# Patient Record
Sex: Male | Born: 1985
Health system: Southern US, Community
[De-identification: ages and names within clinical notes are randomized; demographics above are authoritative.]

## PROBLEM LIST (undated history)

## (undated) DIAGNOSIS — E785 Hyperlipidemia, unspecified: Secondary | ICD-10-CM

## (undated) DIAGNOSIS — E669 Obesity, unspecified: Secondary | ICD-10-CM

## (undated) HISTORY — DX: Hyperlipidemia, unspecified: E78.5

---
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
## 2004-04-08 ENCOUNTER — Ambulatory Visit: Payer: Self-pay | Admitting: Internal Medicine

## 2004-05-22 ENCOUNTER — Emergency Department (HOSPITAL_COMMUNITY): Admission: EM | Admit: 2004-05-22 | Discharge: 2004-05-22 | Payer: Self-pay | Admitting: Emergency Medicine

## 2006-03-03 ENCOUNTER — Ambulatory Visit: Payer: Self-pay | Admitting: Internal Medicine

## 2009-05-28 ENCOUNTER — Ambulatory Visit: Payer: Self-pay | Admitting: Internal Medicine

## 2009-05-28 DIAGNOSIS — H6690 Otitis media, unspecified, unspecified ear: Secondary | ICD-10-CM | POA: Insufficient documentation

## 2010-03-16 NOTE — Assessment & Plan Note (Signed)
Summary: NEW TO RE-EST/ [employer]/ HEARING PROBLEM/NWS   Vital Signs:  Patient profile:   25 year old male Height:      68 inches Weight:      242.50 pounds BMI:     37.01 O2 Sat:      99 % on Room air Temp:     98 degrees F oral Pulse rate:   69 / minute BP sitting:   108 / 72  (left arm) Cuff size:   large  Vitals Entered ByZella Ball Ewing (May 28, 2009 1:11 PM)  O2 Flow:  Room air  Preventive Care Screening  Last Tetanus Booster:    Date:  02/14/2006    Results:  Tdap   CC: New to re-establish,right ear hearing problems/RE   CC:  New to re-establish and right ear hearing problems/RE.  History of Present Illness: here with sudden onset mild hearing loss on the right jsut after shower yest; very small pain involved; no sinus or other compaloints, no fever, ST , cough;  headache, no dizziness.  No prior hx of this.  Pt denies new neuro symptoms such as headache, facial or extremity weakness   Preventive Screening-Counseling & Management  Alcohol-Tobacco     Smoking Status: never      Drug Use:  no.    Problems Prior to Update: 1)  Otitis Media, Acute, Right  (ICD-382.9)  Medications Prior to Update: 1)  None  Current Medications (verified): 1)  Clarithromycin 500 Mg Tabs (Clarithromycin) .Marland Kitchen.. 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 mother with HTN father with HTN, elev chol, asthma  Social History: Reviewed history and no changes  required. 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   Head:  normocephalic and atraumatic.   Eyes:  vision grossly intact, pupils equal, and pupils round.   Ears:  right tm mod to severe erythema, slight bulging, left TM ok, no wax in canals Nose:  no external deformity and no nasal discharge.   Mouth:  no gingival abnormalities and pharynx pink and moist.   Neck:  supple and no masses.   Lungs:  normal respiratory effort and normal breath sounds.   Heart:  normal rate and regular rhythm.   Extremities:  no edema, no erythema  Neurologic:  cranial nerves II-XII intact and strength normal in all extremities.  except for mild right hearing decrease   Impression & Recommendations:  Problem # 1:  OTITIS MEDIA, ACUTE, RIGHT (ICD-382.9)  His updated medication list for this problem includes:    Clarithromycin 500 Mg Tabs (Clarithromycin) .Marland Kitchen... 1 by mouth two times a day treat as above, f/u any worsening signs or symptoms   Complete Medication List: 1)  Clarithromycin 500 Mg Tabs (Clarithromycin) .Marland KitchenMarland KitchenMarland Kitchen  1 by mouth two times a day  Patient Instructions: 1)  Please take all new medications as prescribed 2)  Continue all previous medications as before this visit  3)  You can also use Mucinex OTC or it's generic for congestion , as well Sudafed OTC. 4)  Please schedule a follow-up appointment as needed. Prescriptions: CLARITHROMYCIN 500 MG TABS (CLARITHROMYCIN) 1 by mouth two times a day  #20 x 0   Entered and Authorized by:   Corwin Levins MD   Signed by:   Corwin Levins MD on 05/28/2009   Method used:   Print then Give to Patient   RxID:   [id number]

## 2011-05-12 ENCOUNTER — Encounter: Payer: Self-pay | Admitting: Internal Medicine

## 2011-05-12 DIAGNOSIS — Z0001 Encounter for general adult medical examination with abnormal findings: Secondary | ICD-10-CM | POA: Insufficient documentation

## 2011-05-12 DIAGNOSIS — Z0000 Encounter for general adult medical examination without abnormal findings: Secondary | ICD-10-CM | POA: Insufficient documentation

## 2011-05-16 ENCOUNTER — Encounter: Payer: Self-pay | Admitting: Internal Medicine

## 2011-05-16 ENCOUNTER — Ambulatory Visit (INDEPENDENT_AMBULATORY_CARE_PROVIDER_SITE_OTHER): Payer: 59 | Admitting: Internal Medicine

## 2011-05-16 ENCOUNTER — Other Ambulatory Visit (INDEPENDENT_AMBULATORY_CARE_PROVIDER_SITE_OTHER): Payer: 59

## 2011-05-16 VITALS — BP 120/72 | HR 107 | Temp 98.0°F | Ht 68.0 in | Wt 252.4 lb

## 2011-05-16 DIAGNOSIS — A64 Unspecified sexually transmitted disease: Secondary | ICD-10-CM

## 2011-05-16 DIAGNOSIS — Z0000 Encounter for general adult medical examination without abnormal findings: Secondary | ICD-10-CM

## 2011-05-16 LAB — URINALYSIS, ROUTINE W REFLEX MICROSCOPIC
Bilirubin Urine: NEGATIVE
Ketones, ur: NEGATIVE
Leukocytes, UA: NEGATIVE
Urine Glucose: NEGATIVE
pH: 6 (ref 5.0–8.0)

## 2011-05-16 LAB — HEPATIC FUNCTION PANEL
AST: 20 U/L (ref 0–37)
Alkaline Phosphatase: 54 U/L (ref 39–117)
Bilirubin, Direct: 0 mg/dL (ref 0.0–0.3)
Total Bilirubin: 0.2 mg/dL — ABNORMAL LOW (ref 0.3–1.2)

## 2011-05-16 LAB — CBC WITH DIFFERENTIAL/PLATELET
Basophils Absolute: 0 10*3/uL (ref 0.0–0.1)
Eosinophils Relative: 1 % (ref 0.0–5.0)
HCT: 44.4 % (ref 39.0–52.0)
Lymphs Abs: 1.7 10*3/uL (ref 0.7–4.0)
MCV: 72.7 fl — ABNORMAL LOW (ref 78.0–100.0)
Monocytes Absolute: 0.4 10*3/uL (ref 0.1–1.0)
Platelets: 228 10*3/uL (ref 150.0–400.0)
RDW: 15.5 % — ABNORMAL HIGH (ref 11.5–14.6)

## 2011-05-16 LAB — LIPID PANEL
LDL Cholesterol: 112 mg/dL — ABNORMAL HIGH (ref 0–99)
VLDL: 33.8 mg/dL (ref 0.0–40.0)

## 2011-05-16 LAB — BASIC METABOLIC PANEL
GFR: 116.88 mL/min (ref >60.00)
Potassium: 3.9 mEq/L (ref 3.5–5.1)
Sodium: 141 mEq/L (ref 135–145)

## 2011-05-16 LAB — TSH: TSH: 1.08 u[IU]/mL (ref 0.35–5.50)

## 2011-05-16 NOTE — Assessment & Plan Note (Signed)
Asympt, pt reqeusts labs as girlfriend may not be monogamous

## 2011-05-16 NOTE — Patient Instructions (Signed)
You are up to date with prevention today Please focus on being more active, less calories, low cholesterol diet, and weight control Please go to LAB in the Basement for the blood and/or urine tests to be done today (including the ones you asked for) You will be contacted by phone if any changes need to be made immediately.  Otherwise, you will receive a letter about your results with an explanation.

## 2011-05-16 NOTE — Assessment & Plan Note (Signed)

## 2011-05-16 NOTE — Progress Notes (Signed)
  Subjective:    Patient ID: John Porter, male    DOB: 1985/11/30, 26 y.o.   MRN: [id number]  HPI  Here for wellness and f/u;  Overall doing ok;  Pt denies CP, worsening SOB, DOE, wheezing, orthopnea, PND, worsening LE edema, palpitations, dizziness or syncope.  Pt denies neurological change such as new Headache, facial or extremity weakness.  Pt denies polydipsia, polyuria, or low sugar symptoms. Pt states overall good compliance with treatment and medications, good tolerability, and trying to follow lower cholesterol diet.  Pt denies worsening depressive symptoms, suicidal ideation or panic. No fever, wt loss, night sweats, loss of appetite, or other constitutional symptoms.  Pt states good ability with ADL's, low fall risk, home safety reviewed and adequate, no significant changes in hearing or vision, and occasionally active with exercise.  Asks for STD testing as he is not sure his girlfriend is monogamous.  Denies urinary symptoms such as dysuria, frequency, urgency,or hematuria, and no ulcers, vesicles, d/c History reviewed. No pertinent past medical history. History reviewed. No pertinent past surgical history.  reports that he has never smoked. He does not have any smokeless tobacco history on file. He reports that he does not drink alcohol or use illicit drugs. family history includes Asthma in his father; Hyperlipidemia in his father; and Hypertension in his father and mother. No Known Allergies No current outpatient prescriptions on file prior to visit.   Review of Systems Review of Systems  Constitutional: Negative for diaphoresis, activity change, appetite change and unexpected weight change.  HENT: Negative for hearing loss, ear pain, facial swelling, mouth sores and neck stiffness.   Eyes: Negative for pain, redness and visual disturbance.  Respiratory: Negative for shortness of breath and wheezing.   Cardiovascular: Negative for chest pain and palpitations.  Gastrointestinal:  Negative for diarrhea, blood in stool, abdominal distention and rectal pain.  Genitourinary: Negative for hematuria, flank pain and decreased urine volume.  Musculoskeletal: Negative for myalgias and joint swelling.  Skin: Negative for color change and wound.  Neurological: Negative for syncope and numbness.  Hematological: Negative for adenopathy.  Psychiatric/Behavioral: Negative for hallucinations, self-injury, decreased concentration and agitation.      Objective:   Physical Exam BP 120/72  Pulse 107  Temp(Src) 98 F (36.7 C) (Oral)  Ht 5\' 8"  (1.727 m)  Wt 252 lb 6 oz (114.477 kg)  BMI 38.37 kg/m2  SpO2 97% Physical Exam  VS noted Constitutional: Pt is oriented to person, place, and time. Appears well-developed and well-nourished. /severe obese HENT:  Head: Normocephalic and atraumatic.  Right Ear: External ear normal.  Left Ear: External ear normal.  Nose: Nose normal.  Mouth/Throat: Oropharynx is clear and moist.  Eyes: Conjunctivae and EOM are normal. Pupils are equal, round, and reactive to light.  Neck: Normal range of motion. Neck supple. No JVD present. No tracheal deviation present.  Cardiovascular: Normal rate, regular rhythm, normal heart sounds and intact distal pulses.   Pulmonary/Chest: Effort normal and breath sounds normal.  Abdominal: Soft. Bowel sounds are normal. There is no tenderness.  Musculoskeletal: Normal range of motion. Exhibits no edema.  Lymphadenopathy:  Has no cervical adenopathy.  Neurological: Pt is alert and oriented to person, place, and time. Pt has normal reflexes. No cranial nerve deficit.  Skin: Skin is warm and dry. No rash noted.  Psychiatric:  Has  normal mood and affect. Behavior is normal.  GU: normal male without rash, ulcer, d/c    Assessment & Plan:

## 2011-05-17 ENCOUNTER — Encounter: Payer: Self-pay | Admitting: Internal Medicine

## 2011-05-17 DIAGNOSIS — A6000 Herpesviral infection of urogenital system, unspecified: Secondary | ICD-10-CM | POA: Insufficient documentation

## 2011-05-17 LAB — HEPATITIS PANEL, ACUTE: Hep A IgM: NEGATIVE

## 2011-05-17 LAB — HSV 2 ANTIBODY, IGG: HSV 2 Glycoprotein G Ab, IgG: 2.3 IV — ABNORMAL HIGH

## 2011-05-17 LAB — RPR

## 2011-06-01 ENCOUNTER — Ambulatory Visit (INDEPENDENT_AMBULATORY_CARE_PROVIDER_SITE_OTHER): Payer: 59 | Admitting: Internal Medicine

## 2011-06-01 ENCOUNTER — Encounter: Payer: Self-pay | Admitting: Internal Medicine

## 2011-06-01 DIAGNOSIS — A6000 Herpesviral infection of urogenital system, unspecified: Secondary | ICD-10-CM

## 2011-06-01 DIAGNOSIS — E785 Hyperlipidemia, unspecified: Secondary | ICD-10-CM

## 2011-06-01 HISTORY — DX: Hyperlipidemia, unspecified: E78.5

## 2011-06-01 MED ORDER — VALACYCLOVIR HCL 500 MG PO TABS: 500.0000 mg | ORAL_TABLET | Freq: Two times a day (BID) | ORAL | Status: DC

## 2011-06-01 NOTE — Patient Instructions (Addendum)
Take all new medications as prescribed Continue all other medications as before Please follow lower cholesterol diet

## 2011-06-05 ENCOUNTER — Encounter: Payer: Self-pay | Admitting: Internal Medicine

## 2011-06-05 NOTE — Assessment & Plan Note (Signed)
Lab Results  Component Value Date   LDLCALC 112* 05/16/2011   For lower chol diet,  to f/u any worsening symptoms or concerns

## 2011-06-05 NOTE — Progress Notes (Signed)
  Subjective:    Patient ID: John Porter, male    DOB: 1985/02/28, 26 y.o.   MRN: [id number]  HPI  Here to f/u recent labs, no complaints.  Pt denies chest pain, increased sob or doe, wheezing, orthopnea, PND, increased LE swelling, palpitations, dizziness or syncope.  Trying to follow lower chol diet.   Denies urinary symptoms such as dysuria, frequency, urgency,or hematuria or rash such as herpes. Past Medical History  Diagnosis Date  . Hyperlipidemia 06/01/2011   No past surgical history on file.  reports that he has never smoked. He does not have any smokeless tobacco history on file. He reports that he does not drink alcohol or use illicit drugs. family history includes Asthma in his father; Hyperlipidemia in his father; and Hypertension in his father and mother. No Known Allergies No current outpatient prescriptions on file prior to visit.   Review of Systems All otherwise neg per pt     Objective:   Physical Exam BP 118/78  Pulse 82  Temp(Src) 98.7 F (37.1 C) (Oral)  Ht 5\' 7"  (1.702 m)  Wt 253 lb (114.76 kg)  BMI 39.63 kg/m2  SpO2 97% Physical Exam  VS noted Constitutional: Pt appears well-developed and well-nourished.  HENT: Head: Normocephalic.  Right Ear: External ear normal.  Left Ear: External ear normal.  Eyes: Conjunctivae and EOM are normal. Pupils are equal, round, and reactive to light.  Neck: Normal range of motion. Neck supple.  Cardiovascular: Normal rate and regular rhythm.   Pulmonary/Chest: Effort normal and breath sounds normal.  Neurological: Pt is alert Skin: Skin is warm. No erythema. No rash  Psychiatric: Pt behavior is normal. Thought content normal.     Assessment & Plan:

## 2011-06-05 NOTE — Assessment & Plan Note (Signed)
D/w pt, pt is asympt and no hx of rash, serology +, for safe sex at all times, pt reqeusts suppressive tx

## 2011-06-07 ENCOUNTER — Ambulatory Visit: Payer: 59 | Admitting: Internal Medicine

## 2011-11-08 ENCOUNTER — Telehealth: Payer: Self-pay

## 2011-11-08 NOTE — Telephone Encounter (Signed)
Called the patient and he requested to have [facility] added to his pharmacy list.

## 2011-11-08 NOTE — Telephone Encounter (Signed)
Patient called [facility] requesting a call back to get started on mail order rx Thanks

## 2011-11-11 ENCOUNTER — Other Ambulatory Visit: Payer: Self-pay

## 2011-11-11 MED ORDER — VALACYCLOVIR HCL 500 MG PO TABS: 500.0000 mg | ORAL_TABLET | Freq: Two times a day (BID) | ORAL | Status: DC

## 2012-04-24 ENCOUNTER — Other Ambulatory Visit: Payer: Self-pay | Admitting: Internal Medicine

## 2012-04-24 ENCOUNTER — Other Ambulatory Visit (INDEPENDENT_AMBULATORY_CARE_PROVIDER_SITE_OTHER): Payer: 59

## 2012-04-24 ENCOUNTER — Encounter: Payer: Self-pay | Admitting: Internal Medicine

## 2012-04-24 ENCOUNTER — Ambulatory Visit (INDEPENDENT_AMBULATORY_CARE_PROVIDER_SITE_OTHER): Payer: 59 | Admitting: Internal Medicine

## 2012-04-24 VITALS — BP 104/70 | HR 73 | Temp 97.5°F | Ht 68.0 in | Wt 249.5 lb

## 2012-04-24 DIAGNOSIS — A64 Unspecified sexually transmitted disease: Secondary | ICD-10-CM

## 2012-04-24 DIAGNOSIS — A6000 Herpesviral infection of urogenital system, unspecified: Secondary | ICD-10-CM

## 2012-04-24 DIAGNOSIS — J019 Acute sinusitis, unspecified: Secondary | ICD-10-CM

## 2012-04-24 DIAGNOSIS — E785 Hyperlipidemia, unspecified: Secondary | ICD-10-CM

## 2012-04-24 DIAGNOSIS — Z0000 Encounter for general adult medical examination without abnormal findings: Secondary | ICD-10-CM

## 2012-04-24 LAB — HEPATIC FUNCTION PANEL
ALT: 36 U/L (ref 0–53)
AST: 42 U/L — ABNORMAL HIGH (ref 0–37)
Albumin: 4.1 g/dL (ref 3.5–5.2)

## 2012-04-24 LAB — CBC WITH DIFFERENTIAL/PLATELET
Basophils Relative: 0.3 % (ref 0.0–3.0)
Eosinophils Relative: 1 % (ref 0.0–5.0)
HCT: 42.2 % (ref 39.0–52.0)
Hemoglobin: 13.7 g/dL (ref 13.0–17.0)
Lymphs Abs: 1.7 10*3/uL (ref 0.7–4.0)
Monocytes Relative: 6.6 % (ref 3.0–12.0)
Neutro Abs: 3.5 10*3/uL (ref 1.4–7.7)
RBC: 5.84 Mil/uL — ABNORMAL HIGH (ref 4.22–5.81)
RDW: 14.9 % — ABNORMAL HIGH (ref 11.5–14.6)
WBC: 5.6 10*3/uL (ref 4.5–10.5)

## 2012-04-24 LAB — LIPID PANEL
HDL: 28.2 mg/dL — ABNORMAL LOW (ref >39.00)
Triglycerides: 119 mg/dL (ref 0.0–149.0)

## 2012-04-24 LAB — BASIC METABOLIC PANEL
Calcium: 9.5 mg/dL (ref 8.4–10.5)
Creatinine, Ser: 1.2 mg/dL (ref 0.4–1.5)
GFR: 97.64 mL/min (ref >60.00)
Sodium: 138 mEq/L (ref 135–145)

## 2012-04-24 LAB — TSH: TSH: 2.64 u[IU]/mL (ref 0.35–5.50)

## 2012-04-24 MED ORDER — AZITHROMYCIN 250 MG PO TABS: ORAL_TABLET | ORAL | Status: DC

## 2012-04-24 MED ORDER — VALACYCLOVIR HCL 500 MG PO TABS: 500.0000 mg | ORAL_TABLET | Freq: Every day | ORAL | Status: AC

## 2012-04-24 MED ORDER — VALACYCLOVIR HCL 500 MG PO TABS: 500.0000 mg | ORAL_TABLET | Freq: Every day | ORAL | Status: DC

## 2012-04-24 MED ORDER — HYDROCODONE-HOMATROPINE 5-1.5 MG/5ML PO SYRP: 5.0000 mL | ORAL_SOLUTION | Freq: Four times a day (QID) | ORAL | Status: DC | PRN

## 2012-04-24 NOTE — Assessment & Plan Note (Signed)
Ok for STD labs per pt request includin HIV

## 2012-04-24 NOTE — Patient Instructions (Addendum)
Please stop at the front desk to let then know the SSN in EPIC is apparently incorrect Please take all new medication as prescribed - the antibiotic, cough medicine Your Valtrex was sent to the [facility] pharmacy Please go to the LAB in the Basement (turn left off the elevator) for the tests to be done today - just the STD tests today You will be contacted by phone if any changes need to be made immediately.  Otherwise, you will receive a letter about your results with an explanation, but please check with MyChart first. Thank you for enrolling in MyChart. Please follow the instructions below to securely access your online medical record. MyChart allows you to send messages to your doctor, view your test results, renew your prescriptions, schedule appointments, and more. To Log into My Chart online, please go by [employer] or [employer] to [email or web address], or download the MyChart App from the [employer] .  Your Username is: [email or web address] ([email or web address]) Please return in 1 year for your yearly visit, or sooner if needed, with Lab testing done 3-5 days before

## 2012-04-24 NOTE — Assessment & Plan Note (Signed)
Stable, for valtrex suppressive tx per pt request

## 2012-04-24 NOTE — Progress Notes (Signed)
  Subjective:    Patient ID: John Porter, male    DOB: 09-Sep-1985, 27 y.o.   MRN: [id number]  HPI    Here with 2-3 days acute onset fever, facial pain, pressure, headache, general weakness and malaise, and greenish d/c, with mild ST and cough, but pt denies chest pain, wheezing, increased sob or doe, orthopnea, PND, increased LE swelling, palpitations, dizziness or syncope.  Denies urinary symptoms such as dysuria, frequency, urgency, flank pain, hematuria or n/v, fever, chills, and has hx of genital herpes, no recent outbreaks, needs valtrex refill, and asks for f/u labs for STD though denies penile d/c, ulcer or sore Past Medical History  Diagnosis Date  . Hyperlipidemia 06/01/2011   No past surgical history on file.  reports that he has never smoked. He does not have any smokeless tobacco history on file. He reports that he does not drink alcohol or use illicit drugs. family history includes Asthma in his father; Hyperlipidemia in his father; and Hypertension in his father and mother. No Known Allergies No current outpatient prescriptions on file prior to visit.   No current facility-administered medications on file prior to visit.   Review of Systems  Constitutional: Negative for unexpected weight change, or unusual diaphoresis  HENT: Negative for tinnitus.   Eyes: Negative for photophobia and visual disturbance.  Respiratory: Negative for choking and stridor.   Gastrointestinal: Negative for vomiting and blood in stool.  Genitourinary: Negative for hematuria and decreased urine volume.  Musculoskeletal: Negative for acute joint swelling Skin: Negative for color change and wound.  Neurological: Negative for tremors and numbness other than noted  Psychiatric/Behavioral: Negative for decreased concentration or  hyperactivity.       Objective:   Physical Exam BP 104/70  Pulse 73  Temp(Src) 97.5 F (36.4 C) (Oral)  Ht 5\' 8"  (1.727 m)  Wt 249 lb 8 oz (113.172 kg)  BMI 37.94 kg/m2   SpO2 98% VS noted, mild ill Constitutional: Pt appears well-developed and well-nourished.  HENT: Head: NCAT.  Right Ear: External ear normal.  Left Ear: External ear normal.  Bilat tm's with mild erythema.  Max sinus areas mild tender.  Pharynx with mild erythema, no exudate Eyes: Conjunctivae and EOM are normal. Pupils are equal, round, and reactive to light.  Neck: Normal range of motion. Neck supple.  Cardiovascular: Normal rate and regular rhythm.   Pulmonary/Chest: Effort normal and breath sounds normal.  GU: normal male penis, scrotal contents without d/c, ulcer, sore Neurological: Pt is alert. Not confused  Skin: Skin is warm. No erythema.  Psychiatric: Pt behavior is normal. Thought content normal.     Assessment & Plan:

## 2012-04-24 NOTE — Assessment & Plan Note (Signed)
Mild to mod, for antibx course,  to f/u any worsening symptoms or concerns 

## 2012-04-24 NOTE — Assessment & Plan Note (Signed)
D/w pt, stable overall by history and exam, recent data reviewed with pt, and pt to continue medical treatment as before,  to f/u any worsening symptoms or concerns Lab Results  Component Value Date   LDLCALC 104* 04/24/2012   For low chol diet

## 2012-04-25 LAB — GC/CHLAMYDIA PROBE AMP, URINE: Chlamydia, Swab/Urine, PCR: NEGATIVE

## 2012-04-25 LAB — RPR

## 2012-04-25 LAB — URINALYSIS, ROUTINE W REFLEX MICROSCOPIC
Hgb urine dipstick: NEGATIVE
Ketones, ur: NEGATIVE mg/dL
Leukocytes, UA: NEGATIVE
Nitrite: NEGATIVE
Protein, ur: NEGATIVE mg/dL

## 2012-04-26 LAB — HEPATITIS PANEL, ACUTE
HCV Ab: NEGATIVE
Hep A IgM: NEGATIVE
Hep B C IgM: NEGATIVE
Hepatitis B Surface Ag: NEGATIVE

## 2012-08-01 ENCOUNTER — Ambulatory Visit: Payer: 59 | Admitting: Internal Medicine

## 2012-08-02 ENCOUNTER — Encounter: Payer: Self-pay | Admitting: Internal Medicine

## 2012-08-02 ENCOUNTER — Ambulatory Visit (INDEPENDENT_AMBULATORY_CARE_PROVIDER_SITE_OTHER): Payer: 59 | Admitting: Internal Medicine

## 2012-08-02 ENCOUNTER — Ambulatory Visit: Payer: 59 | Admitting: Internal Medicine

## 2012-08-02 VITALS — BP 110/72 | HR 90 | Temp 97.9°F | Ht 67.0 in | Wt 256.2 lb

## 2012-08-02 DIAGNOSIS — J019 Acute sinusitis, unspecified: Secondary | ICD-10-CM

## 2012-08-02 MED ORDER — LEVOFLOXACIN 250 MG PO TABS: 250.0000 mg | ORAL_TABLET | Freq: Every day | ORAL | Status: DC

## 2012-08-02 NOTE — Assessment & Plan Note (Signed)
Mild to mod, for antibx course,  to f/u any worsening symptoms or concerns 

## 2012-08-02 NOTE — Progress Notes (Signed)
  Subjective:    Patient ID: John Porter, male    DOB: [birth year], 27 y.o.   MRN: [id number]  HPI   Here with 2-3 days acute onset fever, facial pain, pressure, headache, general weakness and malaise, and greenish d/c, with mild ST and cough, but pt denies chest pain, wheezing, increased sob or doe, orthopnea, PND, increased LE swelling, palpitations, dizziness or syncope. Past Medical History  Diagnosis Date  . Hyperlipidemia 06/01/2011   No past surgical history on file.  reports that he has never smoked. He does not have any smokeless tobacco history on file. He reports that he does not drink alcohol or use illicit drugs. family history includes Asthma in his father; Hyperlipidemia in his father; and Hypertension in his father and mother. No Known Allergies Current Outpatient Prescriptions on File Prior to Visit  Medication Sig Dispense Refill  . valACYclovir (VALTREX) 500 MG tablet Take 1 tablet (500 mg total) by mouth daily.  90 tablet  3   No current facility-administered medications on file prior to visit.   Review of Systems All otherwise neg per pt     Objective:   Physical Exam BP 110/72  Pulse 90  Temp(Src) 97.9 F (36.6 C) (Oral)  Ht 5\' 7"  (1.702 m)  Wt 256 lb 4 oz (116.234 kg)  BMI 40.12 kg/m2  SpO2 97% VS noted, mild ill Constitutional: Pt appears well-developed and well-nourished.  HENT: Head: NCAT.  Right Ear: External ear normal.  Left Ear: External ear normal.  Bilat tm's with mild erythema.  Max sinus areas mild tender.  Pharynx with mild erythema, no exudate Eyes: Conjunctivae and EOM are normal. Pupils are equal, round, and reactive to light.  Neck: Normal range of motion. Neck supple.  Cardiovascular: Normal rate and regular rhythm.   Pulmonary/Chest: Effort normal and breath sounds normal.  Neurological: Pt is alert. Not confused  Skin: Skin is warm. No erythema.  Psychiatric: Pt behavior is normal. Thought content normal.     Assessment & Plan:

## 2012-08-02 NOTE — Patient Instructions (Signed)
Please take all new medication as prescribed - the antibiotic You can also take Mucinex (or it's generic off brand) for congestion, and tylenol as needed for pain. Please continue all other medications as before, and refills have been done if requested. You are given the work note for tonight's night shift  Please remember to sign up for My Chart if you have not done so, as this will be important to you in the future with finding out test results, communicating by private email, and scheduling acute appointments online when needed.

## 2012-12-20 ENCOUNTER — Other Ambulatory Visit: Payer: Self-pay

## 2013-04-04 ENCOUNTER — Ambulatory Visit (INDEPENDENT_AMBULATORY_CARE_PROVIDER_SITE_OTHER): Payer: 59 | Admitting: Internal Medicine

## 2013-04-04 ENCOUNTER — Encounter: Payer: Self-pay | Admitting: Internal Medicine

## 2013-04-04 VITALS — BP 112/72 | HR 100 | Temp 98.6°F | Ht 68.0 in | Wt 275.8 lb

## 2013-04-04 DIAGNOSIS — B349 Viral infection, unspecified: Secondary | ICD-10-CM

## 2013-04-04 DIAGNOSIS — B9789 Other viral agents as the cause of diseases classified elsewhere: Secondary | ICD-10-CM

## 2013-04-04 MED ORDER — OSELTAMIVIR PHOSPHATE 75 MG PO CAPS: 75.0000 mg | ORAL_CAPSULE | Freq: Two times a day (BID) | ORAL | Status: DC

## 2013-04-04 NOTE — Assessment & Plan Note (Signed)
prob influenza, no lower resp component, for tamiflu asd, work note give, rest/fluids/tylenol

## 2013-04-04 NOTE — Patient Instructions (Addendum)
Please take all new medication as prescribed - the tamiflu antibiotic  Please continue all other medications as before, and refills have been done if requested.  Please have the pharmacy call with any other refills you may need.  Remember to get plenty of rest, fluids and tylenol as needed for pain.  You are given the work note as well.

## 2013-04-04 NOTE — Progress Notes (Signed)
   Subjective:    Patient ID: Edwena BundeJoe C Searing, male    DOB: 09/13/1985, 28 y.o.   MRN: [id number]  HPI  Here with flu like symptoms of feverish, general weakness and malaise, HA, myalgias, mild ST, crampy abd pains, loose stools x 2-3 days, missed work as well.  No cough and Pt denies chest pain, increased sob or doe, wheezing, orthopnea, PND, increased LE swelling, palpitations, dizziness or syncope. Pt denies new neurological symptoms such as new headache, or facial or extremity weakness or numbness   Pt denies polydipsia, polyuria,  Past Medical History  Diagnosis Date  . Hyperlipidemia 06/01/2011   No past surgical history on file.  reports that he has never smoked. He does not have any smokeless tobacco history on file. He reports that he does not drink alcohol or use illicit drugs. family history includes Asthma in his father; Hyperlipidemia in his father; Hypertension in his father and mother. No Known Allergies Current Outpatient Prescriptions on File Prior to Visit  Medication Sig Dispense Refill  . valACYclovir (VALTREX) 500 MG tablet Take 1 tablet (500 mg total) by mouth daily.  90 tablet  3   No current facility-administered medications on file prior to visit.    Review of Systems  All otherwise neg per pt    Objective:   Physical Exam BP 112/72  Pulse 100  Temp(Src) 98.6 F (37 C) (Oral)  Ht 5\' 8"  (1.727 m)  Wt 275 lb 12 oz (125.079 kg)  BMI 41.94 kg/m2  SpO2 99% VS noted, mild ill Constitutional: Pt appears well-developed and well-nourished.  HENT: Head: NCAT.  Right Ear: External ear normal.  Left Ear: External ear normal.  Bilat tm's with mild erythema.  Max sinus areas non tender.  Pharynx with mild erythema, no exudate Eyes: Conjunctivae and EOM are normal. Pupils are equal, round, and reactive to light.  Neck: Normal range of motion. Neck supple.  Cardiovascular: Normal rate and regular rhythm.   Pulmonary/Chest: Effort normal and breath sounds normal.  Abd:   Soft, NT, non-distended, + BS Neurological: Pt is alert. Not confused  Skin: Skin is warm. No erythema.  Psychiatric: Pt behavior is normal. Thought content normal.      Assessment & Plan:

## 2013-04-04 NOTE — Progress Notes (Signed)
Pre-visit discussion using our clinic review tool. No additional management support is needed unless otherwise documented below in the visit note.  

## 2013-05-22 ENCOUNTER — Ambulatory Visit: Payer: 59 | Admitting: Internal Medicine

## 2013-05-22 DIAGNOSIS — Z0289 Encounter for other administrative examinations: Secondary | ICD-10-CM

## 2013-05-24 ENCOUNTER — Ambulatory Visit: Payer: 59 | Admitting: Internal Medicine

## 2013-05-24 DIAGNOSIS — Z0289 Encounter for other administrative examinations: Secondary | ICD-10-CM

## 2013-06-18 ENCOUNTER — Ambulatory Visit: Payer: 59 | Admitting: Internal Medicine

## 2013-06-18 DIAGNOSIS — Z0289 Encounter for other administrative examinations: Secondary | ICD-10-CM

## 2013-12-28 ENCOUNTER — Ambulatory Visit (INDEPENDENT_AMBULATORY_CARE_PROVIDER_SITE_OTHER): Payer: 59 | Admitting: Family Medicine

## 2013-12-28 ENCOUNTER — Encounter: Payer: Self-pay | Admitting: Family Medicine

## 2013-12-28 VITALS — BP 130/84 | Temp 98.0°F | Wt 272.0 lb

## 2013-12-28 DIAGNOSIS — R300 Dysuria: Secondary | ICD-10-CM | POA: Insufficient documentation

## 2013-12-28 LAB — POCT URINALYSIS DIPSTICK
Bilirubin, UA: NEGATIVE
Glucose, UA: NEGATIVE
KETONES UA: NEGATIVE
Nitrite, UA: NEGATIVE
Spec Grav, UA: 1.02
Urobilinogen, UA: NEGATIVE
pH, UA: 5

## 2013-12-28 MED ORDER — AZITHROMYCIN 500 MG PO TABS
1000.0000 mg | ORAL_TABLET | Freq: Once | ORAL | Status: DC
Start: 2013-12-28 — End: 2014-01-14

## 2013-12-28 MED ORDER — CEFTRIAXONE SODIUM 1 G IJ SOLR
250.0000 mg | Freq: Once | INTRAMUSCULAR | Status: AC
  Administered 2013-12-28: 250 mg via INTRAMUSCULAR

## 2013-12-28 NOTE — Patient Instructions (Signed)
Take 2 tabs of zithromax today.  Use protection with intercourse.  Schedule a follow up with Dr. Jonny RuizJohn.  Take care.

## 2013-12-28 NOTE — Addendum Note (Signed)
Addended by: Lieutenant DiegoHINES, Jadakiss Barish A on: 12/28/2013 11:40 AM   Modules accepted: Orders

## 2013-12-28 NOTE — Progress Notes (Signed)
Sx started a few days ago.  Noted burning with urination.  White discharge noted when he isn't urinating. No FCNAVD.  No changes in BMs.   Sexually active w/o protection.  Prev tested for STD prev, know h/o HSV.  No h/o GC/chlam noted.   Mult partners.   D/w pt about safer sex.    Meds, vitals, and allergies reviewed.   ROS: See HPI.  Otherwise, noncontributory.  nad ncat Mmm rrr ctab Abd soft, not ttp Testes bilaterally descended without nodularity, tenderness or masses. No scrotal masses or lesions. No penis lesions but some white urethral discharge noted.

## 2013-12-28 NOTE — Assessment & Plan Note (Signed)
With discharge.  Can't test for GC Chlam with recent urine sample.  Would treat for GC and chlam presumptively.   D/w pt about safer sex.  zmax PO at pharmacy and 250mg  rocephin IM here.  D/w pt.  Will need f/u with PCP.

## 2014-01-14 ENCOUNTER — Ambulatory Visit (INDEPENDENT_AMBULATORY_CARE_PROVIDER_SITE_OTHER): Payer: 59 | Admitting: Internal Medicine

## 2014-01-14 ENCOUNTER — Encounter: Payer: Self-pay | Admitting: Internal Medicine

## 2014-01-14 VITALS — BP 112/82 | HR 101 | Temp 98.8°F | Ht 67.0 in | Wt 278.5 lb

## 2014-01-14 DIAGNOSIS — E785 Hyperlipidemia, unspecified: Secondary | ICD-10-CM

## 2014-01-14 DIAGNOSIS — Z202 Contact with and (suspected) exposure to infections with a predominantly sexual mode of transmission: Secondary | ICD-10-CM

## 2014-01-14 DIAGNOSIS — Z0000 Encounter for general adult medical examination without abnormal findings: Secondary | ICD-10-CM

## 2014-01-14 DIAGNOSIS — A6000 Herpesviral infection of urogenital system, unspecified: Secondary | ICD-10-CM

## 2014-01-14 NOTE — Progress Notes (Signed)
Subjective:    Patient ID: John Porter, male    DOB: [birth year], 28 y.o.   MRN: [id number]  HPI  Here for wellness and f/u;  Overall doing ok;  Pt denies CP, worsening SOB, DOE, wheezing, orthopnea, PND, worsening LE edema, palpitations, dizziness or syncope.  Pt denies neurological change such as new headache, facial or extremity weakness.  Pt denies polydipsia, polyuria, or low sugar symptoms. Pt states overall good compliance with treatment and medications, good tolerability, and has been trying to follow lower cholesterol diet.  Pt denies worsening depressive symptoms, suicidal ideation or panic. No fever, night sweats, wt loss, loss of appetite, or other constitutional symptoms.  Pt states good ability with ADL's, has low fall risk, home safety reviewed and adequate, no other significant changes in hearing or vision, and only occasionally active with exercise. Declines flu shot.  Asks for STD as has recent unprotected intercourse, Has known hx of genital herpes but no recent outbreak Past Medical History  Diagnosis Date  . Hyperlipidemia 06/01/2011   No past surgical history on file.  reports that he has never smoked. He does not have any smokeless tobacco history on file. He reports that he does not drink alcohol or use illicit drugs. family history includes Asthma in his father; Hyperlipidemia in his father; Hypertension in his father and mother. No Known Allergies No current outpatient prescriptions on file prior to visit.   No current facility-administered medications on file prior to visit.    Review of Systems Constitutional: Negative for increased diaphoresis, other activity, appetite or other siginficant weight change  HENT: Negative for worsening hearing loss, ear pain, facial swelling, mouth sores and neck stiffness.   Eyes: Negative for other worsening pain, redness or visual disturbance.  Respiratory: Negative for shortness of breath and wheezing.   Cardiovascular: Negative  for chest pain and palpitations.  Gastrointestinal: Negative for diarrhea, blood in stool, abdominal distention or other pain Genitourinary: Negative for hematuria, flank pain or change in urine volume.  Musculoskeletal: Negative for myalgias or other joint complaints.  Skin: Negative for color change and wound.  Neurological: Negative for syncope and numbness. other than noted Hematological: Negative for adenopathy. or other swelling Psychiatric/Behavioral: Negative for hallucinations, self-injury, decreased concentration or other worsening agitation.      Objective:   Physical Exam BP 112/82 mmHg  Pulse 101  Temp(Src) 98.8 F (37.1 C) (Oral)  Ht 5\' 7"  (1.702 m)  Wt 278 lb 8 oz (126.327 kg)  BMI 43.61 kg/m2  SpO2 94% VS noted,  Constitutional: Pt is oriented to person, place, and time. Appears well-developed and well-nourished.  Head: Normocephalic and atraumatic.  Right Ear: External ear normal.  Left Ear: External ear normal.  Nose: Nose normal.  Mouth/Throat: Oropharynx is clear and moist.  Eyes: Conjunctivae and EOM are normal. Pupils are equal, round, and reactive to light.  Neck: Normal range of motion. Neck supple. No JVD present. No tracheal deviation present.  Cardiovascular: Normal rate, regular rhythm, normal heart sounds and intact distal pulses.   Pulmonary/Chest: Effort normal and breath sounds without rales or wheezing  Abdominal: Soft. Bowel sounds are normal. NT. No HSM  Musculoskeletal: Normal range of motion. Exhibits no edema.  Lymphadenopathy:  Has no cervical adenopathy.  Neurological: Pt is alert and oriented to person, place, and time. Pt has normal reflexes. No cranial nerve deficit. Motor grossly intact Skin: Skin is warm and dry. No rash noted.  Psychiatric:  Has normal mood and affect. Behavior  is normal.  GU: normal male genitals, no ulcers or d/c    Assessment & Plan:

## 2014-01-14 NOTE — Progress Notes (Signed)
Pre visit review using our clinic review tool, if applicable. No additional management support is needed unless otherwise documented below in the visit note. 

## 2014-01-14 NOTE — Patient Instructions (Signed)
Please continue all other medications as before, and refills have been done if requested.  Please have the pharmacy call with any other refills you may need.  Please continue your efforts at being more active, low cholesterol diet, and weight control.  You are otherwise up to date with prevention measures today.  Please keep your appointments with your specialists as you may have planned  Please go to the LAB in the Basement (turn left off the elevator) for the tests to be done at your convenience  You will be contacted by phone if any changes need to be made immediately.  Otherwise, you will receive a letter about your results with an explanation, but please check with MyChart first.  Please remember to sign up for MyChart if you have not done so, as this will be important to you in the future with finding out test results, communicating by private email, and scheduling acute appointments online when needed.  Please return in 1 year for your yearly visit, or sooner if needed  

## 2014-01-18 NOTE — Assessment & Plan Note (Signed)
For STD labs as ordered, counseled for safe sex,  to f/u any worsening symptoms or concerns

## 2014-01-18 NOTE — Assessment & Plan Note (Signed)

## 2014-01-18 NOTE — Assessment & Plan Note (Signed)
Pt to call for any outbreak for valtex prn

## 2014-01-18 NOTE — Assessment & Plan Note (Signed)
Lab Results  Component Value Date   LDLCALC 104* 04/24/2012   For lower cholesterol diet

## 2014-01-20 ENCOUNTER — Other Ambulatory Visit (INDEPENDENT_AMBULATORY_CARE_PROVIDER_SITE_OTHER): Payer: 59

## 2014-01-20 DIAGNOSIS — E785 Hyperlipidemia, unspecified: Secondary | ICD-10-CM

## 2014-01-20 DIAGNOSIS — Z202 Contact with and (suspected) exposure to infections with a predominantly sexual mode of transmission: Secondary | ICD-10-CM

## 2014-01-20 DIAGNOSIS — Z0000 Encounter for general adult medical examination without abnormal findings: Secondary | ICD-10-CM

## 2014-01-20 LAB — URINALYSIS, ROUTINE W REFLEX MICROSCOPIC
Bilirubin Urine: NEGATIVE
HGB URINE DIPSTICK: NEGATIVE
KETONES UR: NEGATIVE
Leukocytes, UA: NEGATIVE
Nitrite: NEGATIVE
PH: 5.5 (ref 5.0–8.0)
RBC / HPF: NONE SEEN (ref 0–?)
Specific Gravity, Urine: >=1.03 — AB (ref 1.000–1.030)
TOTAL PROTEIN, URINE-UPE24: NEGATIVE
Urine Glucose: NEGATIVE
Urobilinogen, UA: 0.2 (ref 0.0–1.0)
WBC, UA: NONE SEEN (ref 0–?)

## 2014-01-20 LAB — BASIC METABOLIC PANEL
BUN: 15 mg/dL (ref 6–23)
CHLORIDE: 106 meq/L (ref 96–112)
CO2: 24 mEq/L (ref 19–32)
Calcium: 9.4 mg/dL (ref 8.4–10.5)
Creatinine, Ser: 1.2 mg/dL (ref 0.4–1.5)
GFR: 88.4 mL/min (ref >60.00)
Glucose, Bld: 110 mg/dL — ABNORMAL HIGH (ref 70–99)
POTASSIUM: 3.8 meq/L (ref 3.5–5.1)
SODIUM: 138 meq/L (ref 135–145)

## 2014-01-20 LAB — CBC WITH DIFFERENTIAL/PLATELET
BASOS PCT: 1 % (ref 0.0–3.0)
Basophils Absolute: 0.1 10*3/uL (ref 0.0–0.1)
EOS PCT: 0.6 % (ref 0.0–5.0)
Eosinophils Absolute: 0 10*3/uL (ref 0.0–0.7)
HEMATOCRIT: 45.8 % (ref 39.0–52.0)
Hemoglobin: 14.5 g/dL (ref 13.0–17.0)
LYMPHS ABS: 1.9 10*3/uL (ref 0.7–4.0)
Lymphocytes Relative: 31 % (ref 12.0–46.0)
MCHC: 31.7 g/dL (ref 30.0–36.0)
MCV: 72.3 fl — AB (ref 78.0–100.0)
Monocytes Absolute: 0.2 10*3/uL (ref 0.1–1.0)
Monocytes Relative: 3.8 % (ref 3.0–12.0)
NEUTROS PCT: 63.6 % (ref 43.0–77.0)
Neutro Abs: 3.9 10*3/uL (ref 1.4–7.7)
PLATELETS: 233 10*3/uL (ref 150.0–400.0)
RBC: 6.34 Mil/uL — ABNORMAL HIGH (ref 4.22–5.81)
RDW: 15.6 % — ABNORMAL HIGH (ref 11.5–15.5)
WBC: 6.2 10*3/uL (ref 4.0–10.5)

## 2014-01-20 LAB — HEPATIC FUNCTION PANEL
ALT: 46 U/L (ref 0–53)
AST: 30 U/L (ref 0–37)
Albumin: 4.5 g/dL (ref 3.5–5.2)
Alkaline Phosphatase: 52 U/L (ref 39–117)
BILIRUBIN DIRECT: 0.1 mg/dL (ref 0.0–0.3)
BILIRUBIN TOTAL: 0.7 mg/dL (ref 0.2–1.2)
Total Protein: 7.7 g/dL (ref 6.0–8.3)

## 2014-01-20 LAB — LIPID PANEL
Cholesterol: 218 mg/dL — ABNORMAL HIGH (ref 0–200)
HDL: 36.7 mg/dL — ABNORMAL LOW (ref >39.00)
NonHDL: 181.3
Total CHOL/HDL Ratio: 6
Triglycerides: 265 mg/dL — ABNORMAL HIGH (ref 0.0–149.0)
VLDL: 53 mg/dL — AB (ref 0.0–40.0)

## 2014-01-20 LAB — TSH: TSH: 1.32 u[IU]/mL (ref 0.35–4.50)

## 2014-01-20 LAB — LDL CHOLESTEROL, DIRECT: Direct LDL: 146.7 mg/dL

## 2014-01-21 LAB — HIV ANTIBODY (ROUTINE TESTING W REFLEX): HIV: NONREACTIVE

## 2014-01-21 LAB — GC/CHLAMYDIA PROBE AMP, URINE
CHLAMYDIA, SWAB/URINE, PCR: NEGATIVE
GC PROBE AMP, URINE: NEGATIVE

## 2014-01-21 LAB — HEPATITIS PANEL, ACUTE
HCV Ab: NEGATIVE
Hep A IgM: NONREACTIVE
Hep B C IgM: NONREACTIVE
Hepatitis B Surface Ag: NEGATIVE

## 2014-01-21 LAB — RPR

## 2014-03-27 ENCOUNTER — Ambulatory Visit (INDEPENDENT_AMBULATORY_CARE_PROVIDER_SITE_OTHER): Payer: 59 | Admitting: Internal Medicine

## 2014-03-27 ENCOUNTER — Encounter: Payer: Self-pay | Admitting: Internal Medicine

## 2014-03-27 ENCOUNTER — Other Ambulatory Visit: Payer: 59

## 2014-03-27 VITALS — BP 122/80 | HR 72 | Temp 98.1°F | Ht 67.0 in | Wt 274.0 lb

## 2014-03-27 DIAGNOSIS — Z202 Contact with and (suspected) exposure to infections with a predominantly sexual mode of transmission: Secondary | ICD-10-CM

## 2014-03-27 DIAGNOSIS — J029 Acute pharyngitis, unspecified: Secondary | ICD-10-CM | POA: Insufficient documentation

## 2014-03-27 MED ORDER — AZITHROMYCIN 250 MG PO TABS: ORAL_TABLET | ORAL | Status: DC

## 2014-03-27 NOTE — Progress Notes (Signed)
Pre visit review using our clinic review tool, if applicable. No additional management support is needed unless otherwise documented below in the visit note. 

## 2014-03-27 NOTE — Assessment & Plan Note (Signed)
Mild to mod, for antibx course,  to f/u any worsening symptoms or concerns 

## 2014-03-27 NOTE — Patient Instructions (Addendum)
Please take all new medication as prescribed - the antibiotic  You can also take Delsym OTC for cough, and/or Mucinex (or it's generic off brand) for congestion, and tylenol as needed for pain.  Please continue all other medications as before, and refills have been done if requested.  Please have the pharmacy call with any other refills you may need.  Please keep your appointments with your specialists as you may have planned  Please go to the LAB in the Basement (turn left off the elevator) for the tests to be done today  You will be contacted by phone if any changes need to be made immediately.  Otherwise, you will receive a letter about your results with an explanation, but please check with MyChart first.  Please remember to sign up for MyChart if you have not done so, as this will be important to you in the future with finding out test results, communicating by private email, and scheduling acute appointments online when needed.

## 2014-03-27 NOTE — Progress Notes (Signed)
   Subjective:    Patient ID: John Porter, male    DOB: 06/19/1985, 29 y.o.   MRN: [id number]  HPI  Here with severe ST, low grade fever, but no other URI symtpoms  -  Denies facial pain, pressure, headache, and cough, does shave some general weakness and malise, but pt denies chest pain, wheezing, increased sob or doe, orthopnea, PND, increased LE swelling, palpitations, dizziness or syncope. No recent travel, no sick contacts . Also requests repeat STD labs as he cont's to have occas unprotected intercourse Past Medical History  Diagnosis Date  . Hyperlipidemia 06/01/2011   No past surgical history on file.  reports that he has never smoked. He does not have any smokeless tobacco history on file. He reports that he does not drink alcohol or use illicit drugs. family history includes Asthma in his father; Hyperlipidemia in his father; Hypertension in his father and mother. No Known Allergies No current outpatient prescriptions on file prior to visit.   No current facility-administered medications on file prior to visit.   Review of Systems All otherwise neg per pt     Objective:   Physical Exam BP 122/80 mmHg  Pulse 72  Temp(Src) 98.1 F (36.7 C) (Oral)  Ht 5\' 7"  (1.702 m)  Wt 274 lb (124.286 kg)  BMI 42.90 kg/m2 VS noted,  Constitutional: Pt appears well-developed, well-nourished.  HENT: Head: NCAT.  Right Ear: External ear normal.  Left Ear: External ear normal.  Eyes: . Pupils are equal, round, and reactive to light. Conjunctivae and EOM are normal Bilat tm's with mild erythema.  Max sinus areas non tender.  Pharynx with severe erythema without swelling or ulcer, + exudate Neck: Normal range of motion. Neck supple.with bilat submandib LA tender  Cardiovascular: Normal rate and regular rhythm.   Pulmonary/Chest: Effort normal and breath sounds without rales or wheezing.  Neurological: Pt is alert. Not confused , motor grossly intact Skin: Skin is warm. No rash, declines GU  exam Psychiatric: Pt behavior is normal. No agitation.     Assessment & Plan:

## 2014-03-27 NOTE — Assessment & Plan Note (Signed)
Ok for std labs, asymp,  to f/u any worsening symptoms or concerns

## 2014-03-28 LAB — RPR

## 2014-03-28 LAB — GC/CHLAMYDIA PROBE AMP, URINE
Chlamydia, Swab/Urine, PCR: NEGATIVE
GC Probe Amp, Urine: NEGATIVE

## 2014-03-28 LAB — HIV ANTIBODY (ROUTINE TESTING W REFLEX): HIV 1&2 Ab, 4th Generation: NONREACTIVE

## 2014-03-28 LAB — HSV 2 ANTIBODY, IGG

## 2014-08-07 ENCOUNTER — Other Ambulatory Visit (INDEPENDENT_AMBULATORY_CARE_PROVIDER_SITE_OTHER): Payer: 59

## 2014-08-07 ENCOUNTER — Encounter: Payer: Self-pay | Admitting: Internal Medicine

## 2014-08-07 ENCOUNTER — Other Ambulatory Visit: Payer: Self-pay | Admitting: Internal Medicine

## 2014-08-07 ENCOUNTER — Ambulatory Visit (INDEPENDENT_AMBULATORY_CARE_PROVIDER_SITE_OTHER): Payer: 59 | Admitting: Internal Medicine

## 2014-08-07 VITALS — BP 122/80 | HR 83 | Temp 98.2°F | Ht 67.0 in | Wt 266.2 lb

## 2014-08-07 DIAGNOSIS — Z202 Contact with and (suspected) exposure to infections with a predominantly sexual mode of transmission: Secondary | ICD-10-CM

## 2014-08-07 DIAGNOSIS — Z0000 Encounter for general adult medical examination without abnormal findings: Secondary | ICD-10-CM | POA: Diagnosis not present

## 2014-08-07 LAB — URINALYSIS, ROUTINE W REFLEX MICROSCOPIC
BILIRUBIN URINE: NEGATIVE
Hgb urine dipstick: NEGATIVE
Ketones, ur: NEGATIVE
Leukocytes, UA: NEGATIVE
Nitrite: NEGATIVE
PH: 6 (ref 5.0–8.0)
RBC / HPF: NONE SEEN (ref 0–?)
Specific Gravity, Urine: >=1.03 — AB (ref 1.000–1.030)
TOTAL PROTEIN, URINE-UPE24: NEGATIVE
Urine Glucose: NEGATIVE
Urobilinogen, UA: 0.2 (ref 0.0–1.0)

## 2014-08-07 LAB — CBC WITH DIFFERENTIAL/PLATELET
BASOS ABS: 0 10*3/uL (ref 0.0–0.1)
BASOS PCT: 0.3 % (ref 0.0–3.0)
Eosinophils Absolute: 0 10*3/uL (ref 0.0–0.7)
Eosinophils Relative: 0.9 % (ref 0.0–5.0)
HCT: 44.1 % (ref 39.0–52.0)
HEMOGLOBIN: 14.1 g/dL (ref 13.0–17.0)
Lymphocytes Relative: 32.9 % (ref 12.0–46.0)
Lymphs Abs: 1.8 10*3/uL (ref 0.7–4.0)
MCHC: 31.9 g/dL (ref 30.0–36.0)
MCV: 70.9 fl — AB (ref 78.0–100.0)
MONO ABS: 0.3 10*3/uL (ref 0.1–1.0)
Monocytes Relative: 6.2 % (ref 3.0–12.0)
Neutro Abs: 3.2 10*3/uL (ref 1.4–7.7)
Neutrophils Relative %: 59.7 % (ref 43.0–77.0)
Platelets: 215 10*3/uL (ref 150.0–400.0)
RBC: 6.22 Mil/uL — ABNORMAL HIGH (ref 4.22–5.81)
RDW: 15.9 % — AB (ref 11.5–15.5)
WBC: 5.4 10*3/uL (ref 4.0–10.5)

## 2014-08-07 LAB — BASIC METABOLIC PANEL
BUN: 10 mg/dL (ref 6–23)
CALCIUM: 9.2 mg/dL (ref 8.4–10.5)
CO2: 28 mEq/L (ref 19–32)
Chloride: 104 mEq/L (ref 96–112)
Creatinine, Ser: 1.23 mg/dL (ref 0.40–1.50)
GFR: 88.9 mL/min (ref >60.00)
GLUCOSE: 92 mg/dL (ref 70–99)
Potassium: 4.2 mEq/L (ref 3.5–5.1)
SODIUM: 137 meq/L (ref 135–145)

## 2014-08-07 LAB — LIPID PANEL
CHOL/HDL RATIO: 5
Cholesterol: 179 mg/dL (ref 0–200)
HDL: 37.4 mg/dL — AB (ref >39.00)
LDL Cholesterol: 123 mg/dL — ABNORMAL HIGH (ref 0–99)
NONHDL: 141.6
Triglycerides: 93 mg/dL (ref 0.0–149.0)
VLDL: 18.6 mg/dL (ref 0.0–40.0)

## 2014-08-07 LAB — TSH: TSH: 1.16 u[IU]/mL (ref 0.35–4.50)

## 2014-08-07 LAB — HEPATIC FUNCTION PANEL
ALBUMIN: 4.2 g/dL (ref 3.5–5.2)
ALK PHOS: 50 U/L (ref 39–117)
ALT: 41 U/L (ref 0–53)
AST: 26 U/L (ref 0–37)
Bilirubin, Direct: 0.1 mg/dL (ref 0.0–0.3)
TOTAL PROTEIN: 7.1 g/dL (ref 6.0–8.3)
Total Bilirubin: 0.5 mg/dL (ref 0.2–1.2)

## 2014-08-07 NOTE — Assessment & Plan Note (Signed)

## 2014-08-07 NOTE — Progress Notes (Signed)
Pre visit review using our clinic review tool, if applicable. No additional management support is needed unless otherwise documented below in the visit note. 

## 2014-08-07 NOTE — Patient Instructions (Signed)

## 2014-08-07 NOTE — Assessment & Plan Note (Signed)
Ok for STD check,  to f/u any worsening symptoms or concerns

## 2014-08-07 NOTE — Progress Notes (Signed)
Subjective:    Patient ID: John Porter, male    DOB: [birth year], 29 y.o.   MRN: [id number]  HPI  Here for wellness and f/u;  Overall doing ok;  Pt denies Chest pain, worsening SOB, DOE, wheezing, orthopnea, PND, worsening LE edema, palpitations, dizziness or syncope.  Pt denies neurological change such as new headache, facial or extremity weakness.  Pt denies polydipsia, polyuria, or low sugar symptoms. Pt states overall good compliance with treatment and medications, good tolerability, and has been trying to follow appropriate diet.  Pt denies worsening depressive symptoms, suicidal ideation or panic. No fever, night sweats, wt loss, loss of appetite, or other constitutional symptoms.  Pt states good ability with ADL's, has low fall risk, home safety reviewed and adequate, no other significant changes in hearing or vision.  Has new fiancee who is CMA, and requests him to have STD check prior to onset sexual activity.  Pt with hx of genital herpes, no other std in past. No current symtpoms or d/c, Denies urinary symptoms such as dysuria, frequency, urgency, flank pain, hematuria or n/v, fever, chills. Past Medical History  Diagnosis Date  . Hyperlipidemia 06/01/2011   No past surgical history on file.  reports that he has never smoked. He does not have any smokeless tobacco history on file. He reports that he does not drink alcohol or use illicit drugs. family history includes Asthma in his father; Hyperlipidemia in his father; Hypertension in his father and mother. No Known Allergies No current outpatient prescriptions on file prior to visit.   No current facility-administered medications on file prior to visit.    Review of Systems Constitutional: Negative for increased diaphoresis, other activity, appetite or siginficant weight change other than noted HENT: Negative for worsening hearing loss, ear pain, facial swelling, mouth sores and neck stiffness.   Eyes: Negative for other worsening  pain, redness or visual disturbance.  Respiratory: Negative for shortness of breath and wheezing  Cardiovascular: Negative for chest pain and palpitations.  Gastrointestinal: Negative for diarrhea, blood in stool, abdominal distention or other pain Genitourinary: Negative for hematuria, flank pain or change in urine volume.  Musculoskeletal: Negative for myalgias or other joint complaints.  Skin: Negative for color change and wound or drainage.  Neurological: Negative for syncope and numbness. other than noted Hematological: Negative for adenopathy. or other swelling Psychiatric/Behavioral: Negative for hallucinations, SI, self-injury, decreased concentration or other worsening agitation.      Objective:   Physical Exam BP 122/80 mmHg  Pulse 83  Temp(Src) 98.2 F (36.8 C) (Oral)  Ht  (1.702 m)  Wt 266 lb 4 oz (120.77 kg)  BMI 41.69 kg/m2  SpO2 98% VS noted,  Constitutional: Pt is oriented to person, place, and time. Appears well-developed and well-nourished, in no significant distress Head: Normocephalic and atraumatic.  Right Ear: External ear normal.  Left Ear: External ear normal.  Nose: Nose normal.  Mouth/Throat: Oropharynx is clear and moist.  Eyes: Conjunctivae and EOM are normal. Pupils are equal, round, and reactive to light.  Neck: Normal range of motion. Neck supple. No JVD present. No tracheal deviation present or significant neck LA or mass Cardiovascular: Normal rate, regular rhythm, normal heart sounds and intact distal pulses.   Pulmonary/Chest: Effort normal and breath sounds without rales or wheezing  Abdominal: Soft. Bowel sounds are normal. NT. No HSM  Musculoskeletal: Normal range of motion. Exhibits no edema.  Lymphadenopathy:  Has no cervical adenopathy.  Neurological: Pt is alert and oriented  to person, place, and time. Pt has normal reflexes. No cranial nerve deficit. Motor grossly intact Skin: Skin is warm and dry. No rash noted.  Psychiatric:  Has  normal mood and affect. Behavior is normal.  Gu: normal male, no lesions, rash or d/c or tenderness     Assessment & Plan:

## 2014-08-08 LAB — GC/CHLAMYDIA PROBE AMP, URINE
Chlamydia, Swab/Urine, PCR: NEGATIVE
GC Probe Amp, Urine: NEGATIVE

## 2014-08-08 LAB — HIV ANTIBODY (ROUTINE TESTING W REFLEX): HIV: NONREACTIVE

## 2014-08-08 LAB — RPR

## 2014-12-25 ENCOUNTER — Ambulatory Visit (INDEPENDENT_AMBULATORY_CARE_PROVIDER_SITE_OTHER): Payer: 59

## 2014-12-25 ENCOUNTER — Ambulatory Visit (INDEPENDENT_AMBULATORY_CARE_PROVIDER_SITE_OTHER): Payer: 59 | Admitting: Family Medicine

## 2014-12-25 VITALS — BP 118/76 | HR 88 | Temp 99.1°F | Resp 18 | Ht 69.0 in | Wt 274.6 lb

## 2014-12-25 DIAGNOSIS — Z23 Encounter for immunization: Secondary | ICD-10-CM | POA: Diagnosis not present

## 2014-12-25 DIAGNOSIS — M79672 Pain in left foot: Secondary | ICD-10-CM | POA: Diagnosis not present

## 2014-12-25 NOTE — Addendum Note (Signed)
Addended by: Meredith StaggersGREENE, Presley Summerlin R on: 12/25/2014 01:34 PM   Modules accepted: Level of Service

## 2014-12-25 NOTE — Patient Instructions (Signed)
I do not see fractures on your x-ray.  You may have a midfoot sprain or sprain of the ligaments in that area the foot. Wear the postop shoe as needed for the next few days, out of work if needed or can return to work if not having pain or in the postop shoe.  If you're still having pain with walking into next week, follow back up with me to discuss next step. Occasionally take Advil or Aleve over-the-counter as needed, ice on and off for 10-20 minutes for the next few days. See more information on this below.Return to the clinic or go to the nearest emergency room if any of your symptoms worsen or new symptoms occur.  Foot Sprain A foot sprain is an injury to one of the strong bands of tissue (ligaments) that connect and support the many bones in your feet. The ligament can be stretched too much or it can tear. A tear can be either partial or complete. The severity of the sprain depends on how much of the ligament was damaged or torn. CAUSES A foot sprain is usually caused by suddenly twisting or pivoting your foot. RISK FACTORS This injury is more likely to occur in people who:  Play a sport, such as basketball or football.  Exercise or play a sport without warming up.  Start a new workout or sport.  Suddenly increase how long or hard they exercise or play a sport. SYMPTOMS Symptoms of this condition start soon after an injury and include:  Pain, especially in the arch of the foot.  Bruising.  Swelling.  Inability to walk or use the foot to support body weight. DIAGNOSIS This condition is diagnosed with a medical history and physical exam. You may also have imaging tests, such as:  X-rays to make sure there are no broken bones (fractures).  MRI to see if the ligament has torn. TREATMENT Treatment varies depending on the severity of your sprain. Mild sprains can be treated with rest, ice, compression, and elevation (RICE). If your ligament is overstretched or partially torn,  treatment usually involves keeping your foot in a fixed position (immobilization) for a period of time. To help you do this, your health care provider will apply a bandage, splint, or walking boot to keep your foot from moving until it heals. You may also be advised to use crutches or a scooter for a few weeks to avoid bearing weight on your foot while it is healing. If your ligament is fully torn, you may need surgery to reconnect the ligament to the bone. After surgery, a cast or splint will be applied and will need to stay on your foot while it heals. Your health care provider may also suggest exercises or physical therapy to strengthen your foot. HOME CARE INSTRUCTIONS If You Have a Bandage, Splint, or Walking Boot:  Wear it as directed by your health care provider. Remove it only as directed by your health care provider.  Loosen the bandage, splint, or walking boot if your toes become numb and tingle, or if they turn cold and blue. Bathing  If your health care provider approves bathing and showering, cover the bandage or splint with a watertight plastic bag to protect it from water. Do not let the bandage or splint get wet. Managing Pain, Stiffness, and Swelling   If directed, apply ice to the injured area:  Put ice in a plastic bag.  Place a towel between your skin and the bag.  Leave the  ice on for 20 minutes, 2-3 times per day.  Move your toes often to avoid stiffness and to lessen swelling.  Raise (elevate) the injured area above the level of your heart while you are sitting or lying down. Driving  Do not drive or operate heavy machinery while taking pain medicine.  Do not drive while wearing a bandage, splint, or walking boot on a foot that you use for driving. Activity  Rest as directed by your health care provider.  Do not use the injured foot to support your body weight until your health care provider says that you can. Use crutches or other supportive devices as  directed by your health care provider.  Ask your health care provider what activities are safe for you. Gradually increase how much and how far you walk until your health care provider says it is safe to return to full activity.  Do any exercise or physical therapy as directed by your health care provider. General Instructions  If a splint was applied, do not put pressure on any part of it until it is fully hardened. This may take several hours.  Take medicines only as directed by your health care provider. These include over-the-counter medicines and prescription medicines.  Keep all follow-up visits as directed by your health care provider. This is important.  When you can walk without pain, wear supportive shoes that have stiff soles. Do not wear flip-flops, and do not walk barefoot. SEEK MEDICAL CARE IF:  Your pain is not controlled with medicine.  Your bruising or swelling gets worse or does not get better with treatment.  Your splint or walking boot is damaged. SEEK IMMEDIATE MEDICAL CARE IF:  Your foot is numb or blue.  Your foot feels colder than normal.   This information is not intended to replace advice given to you by your health care provider. Make sure you discuss any questions you have with your health care provider.   Document Released: 07/23/2001 Document Revised: 06/17/2014 Document Reviewed: 12/04/2013 Elsevier Interactive Patient Education 2016 [employer]  RICE for Routine Care of Injuries Theroutine careofmanyinjuriesincludes rest, ice, compression, and elevation (RICE therapy). RICE therapy is often recommended for injuries to soft tissues, such as a muscle strain, ligament injuries, bruises, and overuse injuries. It can also be used for some bony injuries. Using RICE therapy can help to relieve pain, lessen swelling, and enable your body to heal. Rest Rest is required to allow your body to heal. This usually involves reducing your normal activities  and avoiding use of the injured part of your body. Generally, you can return to your normal activities when you are comfortable and have been given permission by your health care provider. Ice Icing your injury helps to keep the swelling down, and it lessens pain. Do not apply ice directly to your skin.  Put ice in a plastic bag.  Place a towel between your skin and the bag.  Leave the ice on for 20 minutes, 2-3 times a day. Do this for as long as you are directed by your health care provider. Compression Compression means putting pressure on the injured area. Compression helps to keep swelling down, gives support, and helps with discomfort. Compression may be done with an elastic bandage. If an elastic bandage has been applied, follow these general tips:  Remove and reapply the bandage every 3-4 hours or as directed by your health care provider.  Make sure the bandage is not wrapped too tightly, because this can cut  off circulation. If part of your body beyond the bandage becomes blue, numb, cold, swollen, or more painful, your bandage is most likely too tight. If this occurs, remove your bandage and reapply it more loosely.  See your health care provider if the bandage seems to be making your problems worse rather than better. Elevation Elevation means keeping the injured area raised. This helps to lessen swelling and decrease pain. If possible, your injured area should be elevated at or above the level of your heart or the center of your chest. WHEN SHOULD I SEEK MEDICAL CARE? You should seek medical care if:  Your pain and swelling continue.  Your symptoms are getting worse rather than improving. These symptoms may indicate that further evaluation or further X-rays are needed. Sometimes, X-rays may not show a small broken bone (fracture) until a number of days later. Make a follow-up appointment with your health care provider. WHEN SHOULD I SEEK IMMEDIATE MEDICAL CARE? You should seek  immediate medical care if:  You have sudden severe pain at or below the area of your injury.  You have redness or increased swelling around your injury.  You have tingling or numbness at or below the area of your injury that does not improve after you remove the elastic bandage.   This information is not intended to replace advice given to you by your health care provider. Make sure you discuss any questions you have with your health care provider.   Document Released: 05/15/2000 Document Revised: 10/22/2014 Document Reviewed: 01/08/2014 Elsevier Interactive Patient Education [employer]

## 2014-12-25 NOTE — Progress Notes (Signed)
Subjective:    Patient ID: John Porter, male    DOB: 16-Jun-1985, 29 y.o.   MRN: [id number]  HPI John Porter is a 29 y.o. male  Twisted top of left foot getting out of bed around 5-6 this morning - foot rolled after stepping on remote. Able to initially WB, but sore. Woke up with pain (works overnight). Worse pain now and trouble with WB.  No prior ankle or foot injury or fracture.   NW:GNFA.   Patient Active Problem List   Diagnosis Date Noted  . Possible exposure to STD 01/14/2014  . Hyperlipidemia 06/01/2011  . Genital herpes 05/17/2011  . Preventative health care 05/12/2011   Past Medical History  Diagnosis Date  . Hyperlipidemia 06/01/2011   History reviewed. No pertinent past surgical history. No Known Allergies Prior to Admission medications   Not on File   Social History   Social History  . Marital Status: Single    Spouse Name: N/A  . Number of Children: N/A  . Years of Education: N/A   Occupational History  . Not on file.   Social History Main Topics  . Smoking status: Never Smoker   . Smokeless tobacco: Not on file  . Alcohol Use: No  . Drug Use: No  . Sexual Activity: Not on file   Other Topics Concern  . Not on file   Social History Narrative      Review of Systems  Musculoskeletal: Positive for arthralgias. Negative for joint swelling.  Skin: Negative for color change and wound.       Objective:   Physical Exam  Constitutional: He is oriented to person, place, and time. He appears well-developed and well-nourished.  HENT:  Head: Normocephalic and atraumatic.  Cardiovascular: Intact distal pulses.   Pulmonary/Chest: Effort normal.  Musculoskeletal:       Left ankle: He exhibits decreased range of motion (d/t dorsal foot pain). He exhibits no swelling, no ecchymosis, no deformity and normal pulse. No head of 5th metatarsal and no proximal fibula tenderness found. Achilles tendon normal. Achilles tendon exhibits no pain, no defect and  normal Thompson's test results.       Feet:  Neurological: He is alert and oriented to person, place, and time.  Skin: Skin is warm and dry. No rash noted. No erythema.  Psychiatric: He has a normal mood and affect. His behavior is normal.  Vitals reviewed.   [facility] reading (PRIMARY) by  Dr. Neva Seat: Left foot: no apparent fracture or acute bony findings..      Assessment & Plan:  John Porter is a 29 y.o. male Pain of left midfoot - Plan: DG Foot Complete Left  -Possible mild midfoot sprain or other sprain dorsal foot. Does not appear to have any widening on Lisfranc joint on x-ray, and with his mechanism of injury this would be less likely.  -Postop shoe for now, symptomatic care with ice and elevation, over-the-counter Advil or Aleve if needed. Recheck in 1 week if he still symptomatic, sooner if worse. Note for work given.  Need for prophylactic vaccination and inoculation against influenza - Plan: Flu Vaccine QUAD 36+ mos IM  -Flu vaccine given.  No orders of the defined types were placed in this encounter.   Patient Instructions  I do not see fractures on your x-ray.  You may have a midfoot sprain or sprain of the ligaments in that area the foot. Wear the postop shoe as needed for the next few days, out  of work if needed or can return to work if not having pain or in the postop shoe.  If you're still having pain with walking into next week, follow back up with me to discuss next step. Occasionally take Advil or Aleve over-the-counter as needed, ice on and off for 10-20 minutes for the next few days. See more information on this below.Return to the clinic or go to the nearest emergency room if any of your symptoms worsen or new symptoms occur.  Foot Sprain A foot sprain is an injury to one of the strong bands of tissue (ligaments) that connect and support the many bones in your feet. The ligament can be stretched too much or it can tear. A tear can be either partial or complete. The  severity of the sprain depends on how much of the ligament was damaged or torn. CAUSES A foot sprain is usually caused by suddenly twisting or pivoting your foot. RISK FACTORS This injury is more likely to occur in people who:  Play a sport, such as basketball or football.  Exercise or play a sport without warming up.  Start a new workout or sport.  Suddenly increase how long or hard they exercise or play a sport. SYMPTOMS Symptoms of this condition start soon after an injury and include:  Pain, especially in the arch of the foot.  Bruising.  Swelling.  Inability to walk or use the foot to support body weight. DIAGNOSIS This condition is diagnosed with a medical history and physical exam. You may also have imaging tests, such as:  X-rays to make sure there are no broken bones (fractures).  MRI to see if the ligament has torn. TREATMENT Treatment varies depending on the severity of your sprain. Mild sprains can be treated with rest, ice, compression, and elevation (RICE). If your ligament is overstretched or partially torn, treatment usually involves keeping your foot in a fixed position (immobilization) for a period of time. To help you do this, your health care provider will apply a bandage, splint, or walking boot to keep your foot from moving until it heals. You may also be advised to use crutches or a scooter for a few weeks to avoid bearing weight on your foot while it is healing. If your ligament is fully torn, you may need surgery to reconnect the ligament to the bone. After surgery, a cast or splint will be applied and will need to stay on your foot while it heals. Your health care provider may also suggest exercises or physical therapy to strengthen your foot. HOME CARE INSTRUCTIONS If You Have a Bandage, Splint, or Walking Boot:  Wear it as directed by your health care provider. Remove it only as directed by your health care provider.  Loosen the bandage, splint, or  walking boot if your toes become numb and tingle, or if they turn cold and blue. Bathing  If your health care provider approves bathing and showering, cover the bandage or splint with a watertight plastic bag to protect it from water. Do not let the bandage or splint get wet. Managing Pain, Stiffness, and Swelling   If directed, apply ice to the injured area:  Put ice in a plastic bag.  Place a towel between your skin and the bag.  Leave the ice on for 20 minutes, 2-3 times per day.  Move your toes often to avoid stiffness and to lessen swelling.  Raise (elevate) the injured area above the level of your heart while you are  sitting or lying down. Driving  Do not drive or operate heavy machinery while taking pain medicine.  Do not drive while wearing a bandage, splint, or walking boot on a foot that you use for driving. Activity  Rest as directed by your health care provider.  Do not use the injured foot to support your body weight until your health care provider says that you can. Use crutches or other supportive devices as directed by your health care provider.  Ask your health care provider what activities are safe for you. Gradually increase how much and how far you walk until your health care provider says it is safe to return to full activity.  Do any exercise or physical therapy as directed by your health care provider. General Instructions  If a splint was applied, do not put pressure on any part of it until it is fully hardened. This may take several hours.  Take medicines only as directed by your health care provider. These include over-the-counter medicines and prescription medicines.  Keep all follow-up visits as directed by your health care provider. This is important.  When you can walk without pain, wear supportive shoes that have stiff soles. Do not wear flip-flops, and do not walk barefoot. SEEK MEDICAL CARE IF:  Your pain is not controlled with  medicine.  Your bruising or swelling gets worse or does not get better with treatment.  Your splint or walking boot is damaged. SEEK IMMEDIATE MEDICAL CARE IF:  Your foot is numb or blue.  Your foot feels colder than normal.   This information is not intended to replace advice given to you by your health care provider. Make sure you discuss any questions you have with your health care provider.   Document Released: 07/23/2001 Document Revised: 06/17/2014 Document Reviewed: 12/04/2013 Elsevier Interactive Patient Education 2016 [employer]  RICE for Routine Care of Injuries Theroutine careofmanyinjuriesincludes rest, ice, compression, and elevation (RICE therapy). RICE therapy is often recommended for injuries to soft tissues, such as a muscle strain, ligament injuries, bruises, and overuse injuries. It can also be used for some bony injuries. Using RICE therapy can help to relieve pain, lessen swelling, and enable your body to heal. Rest Rest is required to allow your body to heal. This usually involves reducing your normal activities and avoiding use of the injured part of your body. Generally, you can return to your normal activities when you are comfortable and have been given permission by your health care provider. Ice Icing your injury helps to keep the swelling down, and it lessens pain. Do not apply ice directly to your skin.  Put ice in a plastic bag.  Place a towel between your skin and the bag.  Leave the ice on for 20 minutes, 2-3 times a day. Do this for as long as you are directed by your health care provider. Compression Compression means putting pressure on the injured area. Compression helps to keep swelling down, gives support, and helps with discomfort. Compression may be done with an elastic bandage. If an elastic bandage has been applied, follow these general tips:  Remove and reapply the bandage every 3-4 hours or as directed by your health care  provider.  Make sure the bandage is not wrapped too tightly, because this can cut off circulation. If part of your body beyond the bandage becomes blue, numb, cold, swollen, or more painful, your bandage is most likely too tight. If this occurs, remove your bandage and reapply it more loosely.  See your health care provider if the bandage seems to be making your problems worse rather than better. Elevation Elevation means keeping the injured area raised. This helps to lessen swelling and decrease pain. If possible, your injured area should be elevated at or above the level of your heart or the center of your chest. WHEN SHOULD I SEEK MEDICAL CARE? You should seek medical care if:  Your pain and swelling continue.  Your symptoms are getting worse rather than improving. These symptoms may indicate that further evaluation or further X-rays are needed. Sometimes, X-rays may not show a small broken bone (fracture) until a number of days later. Make a follow-up appointment with your health care provider. WHEN SHOULD I SEEK IMMEDIATE MEDICAL CARE? You should seek immediate medical care if:  You have sudden severe pain at or below the area of your injury.  You have redness or increased swelling around your injury.  You have tingling or numbness at or below the area of your injury that does not improve after you remove the elastic bandage.   This information is not intended to replace advice given to you by your health care provider. Make sure you discuss any questions you have with your health care provider.   Document Released: 05/15/2000 Document Revised: 10/22/2014 Document Reviewed: 01/08/2014 Elsevier Interactive Patient Education [employer]

## 2014-12-30 ENCOUNTER — Ambulatory Visit (INDEPENDENT_AMBULATORY_CARE_PROVIDER_SITE_OTHER): Payer: 59 | Admitting: Family Medicine

## 2014-12-30 VITALS — BP 120/80 | HR 88 | Temp 98.1°F | Resp 16

## 2014-12-30 DIAGNOSIS — M25572 Pain in left ankle and joints of left foot: Secondary | ICD-10-CM

## 2014-12-30 NOTE — Progress Notes (Signed)
Patient ID: John Porter, male    DOB: 20-Feb-1985  Age: 29 y.o. MRN: [id number]  Chief Complaint  Patient presents with  . Follow-up    Left foot    Subjective:   Patient was here 5 days ago with a foot injury. It is cleared up completely now. He needs a note to allow him to return back to work.  Current allergies, medications, problem list, past/family and social histories reviewed.  Objective:  BP 120/80 mmHg  Pulse 88  Temp(Src) 98.1 F (36.7 C) (Oral)  Resp 16  SpO2 98%  Left foot is no longer tender. It has good range of motion.  Assessment & Plan:   Assessment: 1. Pain in joint, ankle and foot, left       Plan: Return to work note      Patient Instructions  Return to regular activity.     No Follow-up on file.   Antania Hoefling, MD 12/30/2014

## 2014-12-30 NOTE — Patient Instructions (Signed)
Return to regular activity

## 2015-11-16 ENCOUNTER — Ambulatory Visit (INDEPENDENT_AMBULATORY_CARE_PROVIDER_SITE_OTHER): Payer: 59 | Admitting: Family Medicine

## 2015-11-16 DIAGNOSIS — Z23 Encounter for immunization: Secondary | ICD-10-CM | POA: Diagnosis not present

## 2015-11-17 NOTE — Progress Notes (Signed)
Immunization encounter only; no provider encounter.

## 2016-03-02 ENCOUNTER — Encounter: Payer: 59 | Admitting: Internal Medicine

## 2016-03-11 ENCOUNTER — Other Ambulatory Visit (INDEPENDENT_AMBULATORY_CARE_PROVIDER_SITE_OTHER): Payer: 59

## 2016-03-11 ENCOUNTER — Ambulatory Visit (INDEPENDENT_AMBULATORY_CARE_PROVIDER_SITE_OTHER): Payer: 59 | Admitting: Internal Medicine

## 2016-03-11 VITALS — BP 132/76 | HR 100 | Resp 20 | Wt 283.0 lb

## 2016-03-11 DIAGNOSIS — R739 Hyperglycemia, unspecified: Secondary | ICD-10-CM | POA: Insufficient documentation

## 2016-03-11 DIAGNOSIS — Z0000 Encounter for general adult medical examination without abnormal findings: Secondary | ICD-10-CM

## 2016-03-11 DIAGNOSIS — R7989 Other specified abnormal findings of blood chemistry: Secondary | ICD-10-CM | POA: Diagnosis not present

## 2016-03-11 DIAGNOSIS — Z202 Contact with and (suspected) exposure to infections with a predominantly sexual mode of transmission: Secondary | ICD-10-CM

## 2016-03-11 LAB — CBC WITH DIFFERENTIAL/PLATELET
BASOS PCT: 0.5 % (ref 0.0–3.0)
Basophils Absolute: 0 10*3/uL (ref 0.0–0.1)
EOS PCT: 0.6 % (ref 0.0–5.0)
Eosinophils Absolute: 0 10*3/uL (ref 0.0–0.7)
HCT: 42.3 % (ref 39.0–52.0)
Hemoglobin: 13.8 g/dL (ref 13.0–17.0)
LYMPHS ABS: 2.1 10*3/uL (ref 0.7–4.0)
Lymphocytes Relative: 25.6 % (ref 12.0–46.0)
MCHC: 32.7 g/dL (ref 30.0–36.0)
MCV: 70.5 fl — ABNORMAL LOW (ref 78.0–100.0)
MONO ABS: 0.4 10*3/uL (ref 0.1–1.0)
Monocytes Relative: 5 % (ref 3.0–12.0)
NEUTROS ABS: 5.5 10*3/uL (ref 1.4–7.7)
NEUTROS PCT: 68.3 % (ref 43.0–77.0)
Platelets: 247 10*3/uL (ref 150.0–400.0)
RBC: 6 Mil/uL — ABNORMAL HIGH (ref 4.22–5.81)
RDW: 15.1 % (ref 11.5–15.5)
WBC: 8.1 10*3/uL (ref 4.0–10.5)

## 2016-03-11 LAB — BASIC METABOLIC PANEL
BUN: 11 mg/dL (ref 6–23)
CO2: 29 mEq/L (ref 19–32)
Calcium: 9.5 mg/dL (ref 8.4–10.5)
Chloride: 103 mEq/L (ref 96–112)
Creatinine, Ser: 1.19 mg/dL (ref 0.40–1.50)
GFR: 91.39 mL/min (ref >60.00)
Glucose, Bld: 90 mg/dL (ref 70–99)
POTASSIUM: 4.1 meq/L (ref 3.5–5.1)
SODIUM: 138 meq/L (ref 135–145)

## 2016-03-11 LAB — URINALYSIS, ROUTINE W REFLEX MICROSCOPIC
Bilirubin Urine: NEGATIVE
Hgb urine dipstick: NEGATIVE
Ketones, ur: NEGATIVE
Leukocytes, UA: NEGATIVE
NITRITE: NEGATIVE
RBC / HPF: NONE SEEN (ref 0–?)
SPECIFIC GRAVITY, URINE: 1.015 (ref 1.000–1.030)
Total Protein, Urine: NEGATIVE
URINE GLUCOSE: NEGATIVE
Urobilinogen, UA: 0.2 (ref 0.0–1.0)
WBC, UA: NONE SEEN (ref 0–?)
pH: 7 (ref 5.0–8.0)

## 2016-03-11 LAB — LIPID PANEL
CHOL/HDL RATIO: 5
Cholesterol: 175 mg/dL (ref 0–200)
HDL: 32.4 mg/dL — ABNORMAL LOW (ref >39.00)
NonHDL: 142.11
Triglycerides: 220 mg/dL — ABNORMAL HIGH (ref 0.0–149.0)
VLDL: 44 mg/dL — AB (ref 0.0–40.0)

## 2016-03-11 LAB — HEPATIC FUNCTION PANEL
ALK PHOS: 49 U/L (ref 39–117)
ALT: 27 U/L (ref 0–53)
AST: 15 U/L (ref 0–37)
Albumin: 4.3 g/dL (ref 3.5–5.2)
BILIRUBIN TOTAL: 0.4 mg/dL (ref 0.2–1.2)
Bilirubin, Direct: 0.1 mg/dL (ref 0.0–0.3)
Total Protein: 7.5 g/dL (ref 6.0–8.3)

## 2016-03-11 LAB — HEMOGLOBIN A1C: Hgb A1c MFr Bld: 5.7 % (ref 4.6–6.5)

## 2016-03-11 LAB — LDL CHOLESTEROL, DIRECT: LDL DIRECT: 110 mg/dL

## 2016-03-11 LAB — TSH: TSH: 1.39 u[IU]/mL (ref 0.35–4.50)

## 2016-03-11 NOTE — Progress Notes (Signed)
Subjective:    Patient ID: John Porter, male    DOB: March 15, 1985, 31 y.o.   MRN: [id number]  HPI  Here for wellness and f/u;  Overall doing ok;  Pt denies Chest pain, worsening SOB, DOE, wheezing, orthopnea, PND, worsening LE edema, palpitations, dizziness or syncope.  Pt denies neurological change such as new headache, facial or extremity weakness.  Pt denies polydipsia, polyuria, or low sugar symptoms. Pt states overall good compliance with treatment and medications, good tolerability, and has been trying to follow appropriate diet.  Pt denies worsening depressive symptoms, suicidal ideation or panic. No fever, night sweats, wt loss, loss of appetite, or other constitutional symptoms.  Pt states good ability with ADL's, has low fall risk, home safety reviewed and adequate, no other significant changes in hearing or vision, and only occasionally active with exercise.  Has a fiancee now pregnant, but this is recent, and he is asking for STD labs again. Wt Readings from Last 3 Encounters:  03/11/16 283 lb (128.4 kg)  12/25/14 274 lb 9.6 oz (124.6 kg)  08/07/14 266 lb 4 oz (120.8 kg)   Past Medical History:  Diagnosis Date  . Hyperlipidemia 06/01/2011   No past surgical history on file.  reports that he has never smoked. He does not have any smokeless tobacco history on file. He reports that he does not drink alcohol or use drugs. family history includes Asthma in his father; Hyperlipidemia in his father; Hypertension in his father and mother. No Known Allergies No current outpatient prescriptions on file prior to visit.   No current facility-administered medications on file prior to visit.    Review of Systems Constitutional: Negative for increased diaphoresis, or other activity, appetite or siginficant weight change other than noted HENT: Negative for worsening hearing loss, ear pain, facial swelling, mouth sores and neck stiffness.   Eyes: Negative for other worsening pain, redness or  visual disturbance.  Respiratory: Negative for choking or stridor Cardiovascular: Negative for other chest pain and palpitations.  Gastrointestinal: Negative for worsening diarrhea, blood in stool, or abdominal distention Genitourinary: Negative for hematuria, flank pain or change in urine volume.  Musculoskeletal: Negative for myalgias or other joint complaints.  Skin: Negative for other color change and wound or drainage.  Neurological: Negative for syncope and numbness. other than noted Hematological: Negative for adenopathy. or other swelling Psychiatric/Behavioral: Negative for hallucinations, SI, self-injury, decreased concentration or other worsening agitation.  All other system neg per pt    Objective:   Physical Exam BP 132/76   Pulse 100   Resp 20   Wt 283 lb (128.4 kg)   SpO2 98%   BMI 41.79 kg/m  VS noted,  Constitutional: Pt is oriented to person, place, and time. Appears well-developed and well-nourished, in no significant distress Head: Normocephalic and atraumatic  Eyes: Conjunctivae and EOM are normal. Pupils are equal, round, and reactive to light Right Ear: External ear normal.  Left Ear: External ear normal Nose: Nose normal.  Mouth/Throat: Oropharynx is clear and moist  Neck: Normal range of motion. Neck supple. No JVD present. No tracheal deviation present or significant neck LA or mass Cardiovascular: Normal rate, regular rhythm, normal heart sounds and intact distal pulses.   Pulmonary/Chest: Effort normal and breath sounds without rales or wheezing  Abdominal: Soft. Bowel sounds are normal. NT. No HSM  Musculoskeletal: Normal range of motion. Exhibits no edema Lymphadenopathy: Has no cervical adenopathy.  Neurological: Pt is alert and oriented to person, place, and time.  Pt has normal reflexes. No cranial nerve deficit. Motor grossly intact Skin: Skin is warm and dry. No rash noted or new ulcers Psychiatric:  Has normal mood and affect. Behavior is  normal.  No other new exam findings       Assessment & Plan:

## 2016-03-11 NOTE — Progress Notes (Signed)
Pre visit review using our clinic review tool, if applicable. No additional management support is needed unless otherwise documented below in the visit note. 

## 2016-03-11 NOTE — Assessment & Plan Note (Signed)
Mild, for a1c, to work on diet, wt control

## 2016-03-11 NOTE — Assessment & Plan Note (Signed)
Aympt, has hx of genital herpes, and asks for labs to r/o other

## 2016-03-11 NOTE — Assessment & Plan Note (Signed)

## 2016-03-11 NOTE — Patient Instructions (Addendum)
You had the Tdap tetanus shot today  Please continue all other medications as before, and refills have been done if requested.  Please have the pharmacy call with any other refills you may need.  Please continue your efforts at being more active, low cholesterol diet, and weight control.  You are otherwise up to date with prevention measures today.  Please keep your appointments with your specialists as you may have planned  Please go to the LAB in the Basement (turn left off the elevator) for the tests to be done today  You will be contacted by phone if any changes need to be made immediately.  Otherwise, you will receive a letter about your results with an explanation, but please check with MyChart first.  Please remember to sign up for MyChart if you have not done so, as this will be important to you in the future with finding out test results, communicating by private email, and scheduling acute appointments online when needed.  Please return in 1 year for your yearly visit, or sooner if needed  

## 2016-03-12 LAB — GC/CHLAMYDIA PROBE AMP
CT PROBE, AMP APTIMA: NOT DETECTED
GC Probe RNA: NOT DETECTED

## 2016-03-12 LAB — RPR

## 2016-03-12 LAB — HIV ANTIBODY (ROUTINE TESTING W REFLEX): HIV 1&2 Ab, 4th Generation: NONREACTIVE

## 2016-12-28 ENCOUNTER — Encounter: Payer: Self-pay | Admitting: Family Medicine

## 2016-12-28 ENCOUNTER — Ambulatory Visit: Payer: 59 | Admitting: Family Medicine

## 2016-12-28 VITALS — BP 134/78 | HR 108 | Temp 98.8°F | Ht 69.0 in | Wt 290.0 lb

## 2016-12-28 DIAGNOSIS — R6889 Other general symptoms and signs: Secondary | ICD-10-CM | POA: Diagnosis not present

## 2016-12-28 NOTE — Patient Instructions (Signed)
Thank you for coming in,   Please try things such as zyrtec-D or allegra-D which is an antihistamine and decongestant.   Please try afrin which will help with nasal congestion but use for only three days.   Please also try using a netti pot on a regular occasion.  Honey can help with a sore throat.      Please feel free to call with any questions or concerns at any time, at [phone]. --Dr. Jamarria Real  

## 2016-12-28 NOTE — Progress Notes (Signed)
  Edwena BundeJoe C Desilva - 31 y.o. male MRN [id number]  Date of birth: 1985-06-12  SUBJECTIVE:  Including CC & ROS.  Chief Complaint  Patient presents with  . Sore Throat    present for 2 days-has been feeling weak, body aches, congestion. He took tylenol did not help.     Mr. Yehuda BuddSpears is a 31 y.o. male that is presenting with flu like symptoms. Having chills but no fever. Having some body aches. Received the flu vaccine. Symptoms have been staying the same. Was sent home from work due to illness. No rashes.    Review of Systems  Constitutional: Positive for chills. Negative for fever.    HISTORY: Past Medical, Surgical, Social, and Family History Reviewed & Updated per EMR.   Pertinent Historical Findings include:  Past Medical History:  Diagnosis Date  . Hyperlipidemia 06/01/2011    No past surgical history on file.  No Known Allergies  Family History  Problem Relation Age of Onset  . Hypertension Mother   . Hypertension Father   . Hyperlipidemia Father   . Asthma Father      Social History   Socioeconomic History  . Marital status: Single    Spouse name: Not on file  . Number of children: Not on file  . Years of education: Not on file  . Highest education level: Not on file  Social Needs  . Financial resource strain: Not on file  . Food insecurity - worry: Not on file  . Food insecurity - inability: Not on file  . Transportation needs - medical: Not on file  . Transportation needs - non-medical: Not on file  Occupational History  . Not on file  Tobacco Use  . Smoking status: Never Smoker  . Smokeless tobacco: Never Used  Substance and Sexual Activity  . Alcohol use: No  . Drug use: No  . Sexual activity: Not on file  Other Topics Concern  . Not on file  Social History Narrative  . Not on file     PHYSICAL EXAM:  VS: BP 134/78 (BP Location: Left Arm, Patient Position: Sitting, Cuff Size: Normal)   Pulse (!) 108   Temp 98.8 F (37.1 C) (Oral)   Ht 5\' 9"   (1.753 m)   Wt 290 lb (131.5 kg)   SpO2 100%   BMI 42.83 kg/m  Physical Exam Gen: NAD, alert, cooperative with exam ENT: normal lips, normal nasal mucosa, tympanic membranes clear and intact bilaterally, normal oropharynx, no cervical lymphadenopathy Eye: normal EOM, normal conjunctiva and lids CV:  no edema, +2 pedal pulses, tachycardic, regular rhythm   Resp: no accessory muscle use, non-labored, clear to auscultation bilaterally, no crackles or wheezes  Skin: no rashes, no areas of induration  Neuro: normal tone, normal sensation to touch Psych:  normal insight, alert and oriented MSK: normal strength, normal gait       ASSESSMENT & PLAN:   Flu-like symptoms Possible for flu versus other viral component. - Counseled on supportive care

## 2016-12-29 DIAGNOSIS — R6889 Other general symptoms and signs: Secondary | ICD-10-CM | POA: Insufficient documentation

## 2016-12-29 NOTE — Assessment & Plan Note (Signed)
Possible for flu versus other viral component. - Counseled on supportive care

## 2016-12-31 ENCOUNTER — Ambulatory Visit: Payer: 59 | Admitting: Family Medicine

## 2016-12-31 ENCOUNTER — Other Ambulatory Visit: Payer: Self-pay

## 2016-12-31 ENCOUNTER — Encounter: Payer: Self-pay | Admitting: Family Medicine

## 2016-12-31 VITALS — BP 141/89 | HR 101 | Temp 98.5°F | Resp 17 | Ht 69.0 in | Wt 289.6 lb

## 2016-12-31 DIAGNOSIS — H66001 Acute suppurative otitis media without spontaneous rupture of ear drum, right ear: Secondary | ICD-10-CM | POA: Diagnosis not present

## 2016-12-31 DIAGNOSIS — J029 Acute pharyngitis, unspecified: Secondary | ICD-10-CM

## 2016-12-31 DIAGNOSIS — H60391 Other infective otitis externa, right ear: Secondary | ICD-10-CM

## 2016-12-31 LAB — POCT RAPID STREP A (OFFICE): Rapid Strep A Screen: NEGATIVE

## 2016-12-31 MED ORDER — CETIRIZINE HCL 10 MG PO TABS: 10.0000 mg | ORAL_TABLET | Freq: Every day | ORAL | 11 refills | Status: DC

## 2016-12-31 MED ORDER — AMOXICILLIN 875 MG PO TABS: 875.0000 mg | ORAL_TABLET | Freq: Two times a day (BID) | ORAL | 0 refills | Status: DC

## 2016-12-31 NOTE — Progress Notes (Signed)
Chief Complaint  Patient presents with  . Sore Throat     since tues morning, gargled with warm water and salt, coughed up blood and brown mucus, tylenol at 11am /12 noonish  . rn, chills, congestion and sporadic cough    HPI   Pt reports that he has been having sore throat since 12/27/16 He also has runny nose, chills, congestion and intermittent cough he reports that his 683 month old daughter is also sick with congestion and cough He gargled his throat with salt water gargle and that has been helping His right eye feels stopped up and it is hard for him to hear on that side He has coughed up blood tinged brown mucus He took tylenol at 11am for pain  He admits to chills and feeling feverish   4 review of systems  Past Medical History:  Diagnosis Date  . Hyperlipidemia 06/01/2011    Current Outpatient Medications  Medication Sig Dispense Refill  . amoxicillin (AMOXIL) 875 MG tablet Take 1 tablet (875 mg total) 2 (two) times daily by mouth. 20 tablet 0  . cetirizine (ZYRTEC) 10 MG tablet Take 1 tablet (10 mg total) daily by mouth. 30 tablet 11   No current facility-administered medications for this visit.     Allergies: No Known Allergies  No past surgical history on file.  Social History   Socioeconomic History  . Marital status: Single    Spouse name: None  . Number of children: None  . Years of education: None  . Highest education level: None  Social Needs  . Financial resource strain: None  . Food insecurity - worry: None  . Food insecurity - inability: None  . Transportation needs - medical: None  . Transportation needs - non-medical: None  Occupational History  . None  Tobacco Use  . Smoking status: Never Smoker  . Smokeless tobacco: Never Used  Substance and Sexual Activity  . Alcohol use: No  . Drug use: No  . Sexual activity: None  Other Topics Concern  . None  Social History Narrative  . None    Family History  Problem Relation Age of Onset   . Hypertension Mother   . Hypertension Father   . Hyperlipidemia Father   . Asthma Father      Review of Systems  Constitutional: Positive for chills, fever and malaise/fatigue.  Eyes: Negative for blurred vision and double vision.  Respiratory: Positive for cough and sputum production. Negative for shortness of breath.   Cardiovascular: Negative for chest pain and palpitations.  Gastrointestinal: Negative for abdominal pain, diarrhea, nausea and vomiting.  Genitourinary: Negative for dysuria and urgency.  Musculoskeletal: Negative for back pain and myalgias.  Skin: Negative for itching and rash.      Objective: Vitals:   12/31/16 1558  BP: (!) 141/89  Pulse: (!) 101  Resp: 17  Temp: 98.5 F (36.9 C)  SpO2: 98%  Weight: 289 lb 9.6 oz (131.4 kg)  Height: 5\' 9"  (1.753 m)    Physical Exam General: alert, oriented, in NAD Head: normocephalic, atraumatic, no sinus tenderness Eyes: EOM intact, no scleral icterus or conjunctival injection Ears: TM clear on left, right TM almost completely obscured but appears to have purulent drainage, the TM is being crowded by erythematous and edematous external canal. Nose: mucosa nonerythematous, nonedematous Throat: no pharyngeal exudate or erythema Lymph: no posterior auricular, submental or cervical lymph adenopathy Heart: normal rate, normal sinus rhythm, no murmurs Lungs: clear to auscultation bilaterally, no wheezing  Assessment and Plan John Porter was seen today for sore throat and rn, chills, congestion and sporadic cough.  Diagnoses and all orders for this visit:  Sore throat- rapid strep negative -     POCT rapid strep A  Infective otitis externa of right ear Acute suppurative otitis media of right ear without spontaneous rupture of tympanic membrane, recurrence not specified  Will treat with high dose amoxicillin Will add zyrtec  -     amoxicillin (AMOXIL) 875 MG tablet; Take 1 tablet (875 mg total) 2 (two) times daily by  mouth. -     cetirizine (ZYRTEC) 10 MG tablet; Take 1 tablet (10 mg total) daily by mouth.     Bhavana Kady A Jamesha Ellsworth

## 2016-12-31 NOTE — Patient Instructions (Addendum)
   IF you received an x-ray today, you will receive an invoice from [facility]. Please contact [facility] at [phone] with questions or concerns regarding your invoice.   IF you received labwork today, you will receive an invoice from [facility]. Please contact [facility] at [phone] with questions or concerns regarding your invoice.   Our billing staff will not be able to assist you with questions regarding bills from these companies.  You will be contacted with the lab results as soon as they are available. The fastest way to get your results is to activate your My Chart account. Instructions are located on the last page of this paperwork. If you have not heard from us regarding the results in 2 weeks, please contact this office.     Otitis Externa Otitis externa is an infection of the outer ear canal. The outer ear canal is the area between the outside of the ear and the eardrum. Otitis externa is sometimes called "swimmer's ear." What are the causes? This condition may be caused by:  Swimming in dirty water.  Moisture in the ear.  An injury to the inside of the ear.  An object stuck in the ear.  A cut or scrape on the outside of the ear.  What increases the risk? This condition is more likely to develop in swimmers. What are the signs or symptoms? The first symptom of this condition is often itching in the ear. Later signs and symptoms include:  Swelling of the ear.  Redness in the ear.  Ear pain. The pain may get worse when you pull on your ear.  Pus coming from the ear.  How is this diagnosed? This condition may be diagnosed by examining the ear and testing fluid from the ear for bacteria and funguses. How is this treated? This condition may be treated with:  Antibiotic ear drops. These are often given for 10-14 days.  Medicine to reduce itching and swelling.  Follow these instructions at home:  If you were prescribed antibiotic ear  drops, apply them as told by your health care provider. Do not stop using the antibiotic even if your condition improves.  Take over-the-counter and prescription medicines only as told by your health care provider.  Keep all follow-up visits as told by your health care provider. This is important. How is this prevented?  Keep your ear dry. Use the corner of a towel to dry your ear after you swim or bathe.  Avoid scratching or putting things in your ear. Doing these things can damage the ear canal or remove the protective wax that lines it, which makes it easier for bacteria and funguses to grow.  Avoid swimming in lakes, polluted water, or pools that may not have the right amount of chlorine.  Consider making ear drops and putting 3 or 4 drops in each ear after you swim. Ask your health care provider about how you can make ear drops. Contact a health care provider if:  You have a fever.  After 3 days your ear is still red, swollen, painful, or draining pus.  Your redness, swelling, or pain gets worse.  You have a severe headache.  You have redness, swelling, pain, or tenderness in the area behind your ear. This information is not intended to replace advice given to you by your health care provider. Make sure you discuss any questions you have with your health care provider. Document Released: 01/31/2005 Document Revised: 03/10/2015 Document Reviewed: 11/10/2014 Elsevier Interactive Patient   Education  2018 [employer]  

## 2017-01-24 ENCOUNTER — Ambulatory Visit: Payer: Self-pay | Admitting: Internal Medicine

## 2017-03-02 ENCOUNTER — Encounter: Payer: Self-pay | Admitting: Internal Medicine

## 2017-03-02 ENCOUNTER — Other Ambulatory Visit (INDEPENDENT_AMBULATORY_CARE_PROVIDER_SITE_OTHER): Payer: 59

## 2017-03-02 ENCOUNTER — Ambulatory Visit: Payer: 59 | Admitting: Internal Medicine

## 2017-03-02 VITALS — BP 108/78 | HR 105 | Temp 98.5°F | Ht 69.0 in | Wt 294.0 lb

## 2017-03-02 DIAGNOSIS — Z202 Contact with and (suspected) exposure to infections with a predominantly sexual mode of transmission: Secondary | ICD-10-CM | POA: Diagnosis not present

## 2017-03-02 DIAGNOSIS — Z0000 Encounter for general adult medical examination without abnormal findings: Secondary | ICD-10-CM

## 2017-03-02 DIAGNOSIS — Z23 Encounter for immunization: Secondary | ICD-10-CM

## 2017-03-02 DIAGNOSIS — R739 Hyperglycemia, unspecified: Secondary | ICD-10-CM | POA: Diagnosis not present

## 2017-03-02 LAB — HEPATIC FUNCTION PANEL
ALBUMIN: 4.6 g/dL (ref 3.5–5.2)
ALT: 41 U/L (ref 0–53)
AST: 21 U/L (ref 0–37)
Alkaline Phosphatase: 50 U/L (ref 39–117)
BILIRUBIN TOTAL: 0.5 mg/dL (ref 0.2–1.2)
Bilirubin, Direct: 0.1 mg/dL (ref 0.0–0.3)
Total Protein: 7.6 g/dL (ref 6.0–8.3)

## 2017-03-02 LAB — BASIC METABOLIC PANEL
BUN: 11 mg/dL (ref 6–23)
CHLORIDE: 103 meq/L (ref 96–112)
CO2: 25 meq/L (ref 19–32)
Calcium: 9.4 mg/dL (ref 8.4–10.5)
Creatinine, Ser: 1.14 mg/dL (ref 0.40–1.50)
GFR: 95.44 mL/min (ref >60.00)
GLUCOSE: 140 mg/dL — AB (ref 70–99)
Potassium: 3.6 mEq/L (ref 3.5–5.1)
SODIUM: 138 meq/L (ref 135–145)

## 2017-03-02 LAB — CBC WITH DIFFERENTIAL/PLATELET
Basophils Absolute: 0 10*3/uL (ref 0.0–0.1)
Basophils Relative: 0.6 % (ref 0.0–3.0)
EOS PCT: 1 % (ref 0.0–5.0)
Eosinophils Absolute: 0.1 10*3/uL (ref 0.0–0.7)
HCT: 42.9 % (ref 39.0–52.0)
Hemoglobin: 13.6 g/dL (ref 13.0–17.0)
LYMPHS ABS: 1.9 10*3/uL (ref 0.7–4.0)
Lymphocytes Relative: 30.1 % (ref 12.0–46.0)
MCHC: 31.8 g/dL (ref 30.0–36.0)
MCV: 72.6 fl — AB (ref 78.0–100.0)
MONO ABS: 0.2 10*3/uL (ref 0.1–1.0)
MONOS PCT: 3.5 % (ref 3.0–12.0)
NEUTROS ABS: 4 10*3/uL (ref 1.4–7.7)
NEUTROS PCT: 64.8 % (ref 43.0–77.0)
PLATELETS: 244 10*3/uL (ref 150.0–400.0)
RBC: 5.91 Mil/uL — AB (ref 4.22–5.81)
RDW: 15.2 % (ref 11.5–15.5)
WBC: 6.2 10*3/uL (ref 4.0–10.5)

## 2017-03-02 LAB — URINALYSIS, ROUTINE W REFLEX MICROSCOPIC
Bilirubin Urine: NEGATIVE
Hgb urine dipstick: NEGATIVE
KETONES UR: NEGATIVE
Leukocytes, UA: NEGATIVE
NITRITE: NEGATIVE
RBC / HPF: NONE SEEN (ref 0–?)
Specific Gravity, Urine: >=1.03 — AB (ref 1.000–1.030)
TOTAL PROTEIN, URINE-UPE24: 30 — AB
URINE GLUCOSE: NEGATIVE
UROBILINOGEN UA: 0.2 (ref 0.0–1.0)
pH: 6 (ref 5.0–8.0)

## 2017-03-02 LAB — TSH: TSH: 1.29 u[IU]/mL (ref 0.35–4.50)

## 2017-03-02 LAB — LIPID PANEL
CHOLESTEROL: 193 mg/dL (ref 0–200)
HDL: 36.3 mg/dL — AB (ref >39.00)
LDL Cholesterol: 129 mg/dL — ABNORMAL HIGH (ref 0–99)
NONHDL: 156.46
TRIGLYCERIDES: 139 mg/dL (ref 0.0–149.0)
Total CHOL/HDL Ratio: 5
VLDL: 27.8 mg/dL (ref 0.0–40.0)

## 2017-03-02 LAB — HEMOGLOBIN A1C: Hgb A1c MFr Bld: 5.9 % (ref 4.6–6.5)

## 2017-03-02 NOTE — Patient Instructions (Addendum)
You had the Tdap tetanus shot today  Please continue all other medications as before, and refills have been done if requested.  Please have the pharmacy call with any other refills you may need.  Please continue your efforts at being more active, low cholesterol diet, and weight control.  You are otherwise up to date with prevention measures today.  Please keep your appointments with your specialists as you may have planned  Please go to the LAB in the Basement (turn left off the elevator) for the tests to be done today  You will be contacted by phone if any changes need to be made immediately.  Otherwise, you will receive a letter about your results with an explanation, but please check with MyChart first.  Please remember to sign up for MyChart if you have not done so, as this will be important to you in the future with finding out test results, communicating by private email, and scheduling acute appointments online when needed.  Please return in 1 year for your yearly visit, or sooner if needed  

## 2017-03-02 NOTE — Progress Notes (Signed)
Subjective:    Patient ID: John Porter, male    DOB: Mar 18, 1985, 32 y.o.   MRN: [id number]  HPI  Here for wellness and f/u;  Overall doing ok;  Pt denies Chest pain, worsening SOB, DOE, wheezing, orthopnea, PND, worsening LE edema, palpitations, dizziness or syncope.  Pt denies neurological change such as new headache, facial or extremity weakness.  Pt denies polydipsia, polyuria, or low sugar symptoms. Pt states overall good compliance with treatment and medications, good tolerability, and has been trying to follow appropriate diet.  Pt denies worsening depressive symptoms, suicidal ideation or panic. No fever, night sweats, wt loss, loss of appetite, or other constitutional symptoms.  Pt states good ability with ADL's, has low fall risk, home safety reviewed and adequate, no other significant changes in hearing or vision, and only occasionally active with exercise  Also asks for STD check due to recent unprotected intercourse. Has gained wt recently, plans to start being more active Wt Readings from Last 3 Encounters:  03/02/17 294 lb (133.4 kg)  12/31/16 289 lb 9.6 oz (131.4 kg)  12/28/16 290 lb (131.5 kg)   Past Medical History:  Diagnosis Date  . Hyperlipidemia 06/01/2011   No past surgical history on file.  reports that  has never smoked. he has never used smokeless tobacco. He reports that he does not drink alcohol or use drugs. family history includes Asthma in his father; Hyperlipidemia in his father; Hypertension in his father and mother. No Known Allergies Current Outpatient Medications on File Prior to Visit  Medication Sig Dispense Refill  . cetirizine (ZYRTEC) 10 MG tablet Take 1 tablet (10 mg total) daily by mouth. 30 tablet 11   No current facility-administered medications on file prior to visit.    Review of Systems Constitutional: Negative for other unusual diaphoresis, sweats, appetite or weight changes HENT: Negative for other worsening hearing loss, ear pain, facial  swelling, mouth sores or neck stiffness.   Eyes: Negative for other worsening pain, redness or other visual disturbance.  Respiratory: Negative for other stridor or swelling Cardiovascular: Negative for other palpitations or other chest pain  Gastrointestinal: Negative for worsening diarrhea or loose stools, blood in stool, distention or other pain Genitourinary: Negative for hematuria, flank pain or other change in urine volume.  Musculoskeletal: Negative for myalgias or other joint swelling.  Skin: Negative for other color change, or other wound or worsening drainage.  Neurological: Negative for other syncope or numbness. Hematological: Negative for other adenopathy or swelling Psychiatric/Behavioral: Negative for hallucinations, other worsening agitation, SI, self-injury, or new decreased concentration All other system neg per pt    Objective:   Physical Exam BP 108/78   Pulse (!) 105   Temp 98.5 F (36.9 C) (Oral)   Ht 5\' 9"  (1.753 m)   Wt 294 lb (133.4 kg)   SpO2 99%   BMI 43.42 kg/m  VS noted,  Constitutional: Pt is oriented to person, place, and time. Appears well-developed and well-nourished, in no significant distress and comfortable Head: Normocephalic and atraumatic  Eyes: Conjunctivae and EOM are normal. Pupils are equal, round, and reactive to light Right Ear: External ear normal without discharge Left Ear: External ear normal without discharge Nose: Nose without discharge or deformity Mouth/Throat: Oropharynx is without other ulcerations and moist  Neck: Normal range of motion. Neck supple. No JVD present. No tracheal deviation present or significant neck LA or mass Cardiovascular: Normal rate, regular rhythm, normal heart sounds and intact distal pulses.   Pulmonary/Chest:  WOB normal and breath sounds without rales or wheezing  Abdominal: Soft. Bowel sounds are normal. NT. No HSM  Musculoskeletal: Normal range of motion. Exhibits no edema Lymphadenopathy: Has no  other cervical adenopathy.  Neurological: Pt is alert and oriented to person, place, and time. Pt has normal reflexes. No cranial nerve deficit. Motor grossly intact, Gait intact Skin: Skin is warm and dry. No rash noted or new ulcerations Psychiatric:  Has normal mood and affect. Behavior is normal without agitation No other exam findings    Assessment & Plan:

## 2017-03-03 LAB — HSV 2 ANTIBODY, IGG

## 2017-03-03 LAB — HIV ANTIBODY (ROUTINE TESTING W REFLEX): HIV 1&2 Ab, 4th Generation: NONREACTIVE

## 2017-03-03 LAB — RPR: RPR Ser Ql: NONREACTIVE

## 2017-03-04 NOTE — Assessment & Plan Note (Signed)
For labs as ordered 

## 2017-03-04 NOTE — Assessment & Plan Note (Signed)
Lab Results  Component Value Date   HGBA1C 5.9 03/02/2017  stable overall by history and exam, recent data reviewed with pt, and pt to continue medical treatment as before,  to f/u any worsening symptoms or concerns

## 2017-03-04 NOTE — Assessment & Plan Note (Signed)

## 2017-03-14 ENCOUNTER — Telehealth: Payer: 59 | Admitting: Nurse Practitioner

## 2017-03-14 DIAGNOSIS — R059 Cough, unspecified: Secondary | ICD-10-CM

## 2017-03-14 DIAGNOSIS — J0100 Acute maxillary sinusitis, unspecified: Secondary | ICD-10-CM

## 2017-03-14 DIAGNOSIS — R05 Cough: Secondary | ICD-10-CM

## 2017-03-14 MED ORDER — LEVOFLOXACIN 500 MG PO TABS: 500.0000 mg | ORAL_TABLET | Freq: Every day | ORAL | 0 refills | Status: DC

## 2017-03-14 NOTE — Progress Notes (Signed)

## 2017-03-21 ENCOUNTER — Encounter: Payer: Self-pay | Admitting: Family Medicine

## 2017-03-21 ENCOUNTER — Ambulatory Visit: Payer: 59 | Admitting: Family Medicine

## 2017-03-21 ENCOUNTER — Ambulatory Visit (HOSPITAL_BASED_OUTPATIENT_CLINIC_OR_DEPARTMENT_OTHER)
Admission: RE | Admit: 2017-03-21 | Discharge: 2017-03-21 | Disposition: A | Discharge status: Home / Self Care | Payer: 59 | Source: Ambulatory Visit | Attending: Family Medicine | Admitting: Family Medicine

## 2017-03-21 VITALS — BP 121/83 | HR 106 | Temp 97.8°F | Resp 18 | Ht 69.0 in | Wt 291.0 lb

## 2017-03-21 DIAGNOSIS — J40 Bronchitis, not specified as acute or chronic: Secondary | ICD-10-CM

## 2017-03-21 DIAGNOSIS — R05 Cough: Secondary | ICD-10-CM | POA: Insufficient documentation

## 2017-03-21 DIAGNOSIS — J324 Chronic pansinusitis: Secondary | ICD-10-CM

## 2017-03-21 DIAGNOSIS — R059 Cough, unspecified: Secondary | ICD-10-CM

## 2017-03-21 MED ORDER — METHYLPREDNISOLONE ACETATE 80 MG/ML IJ SUSP
80.0000 mg | Freq: Once | INTRAMUSCULAR | Status: AC
  Administered 2017-03-21: 80 mg via INTRAMUSCULAR

## 2017-03-21 MED ORDER — PREDNISONE 10 MG PO TABS: ORAL_TABLET | ORAL | 0 refills | Status: DC

## 2017-03-21 MED ORDER — FLUTICASONE PROPIONATE 50 MCG/ACT NA SUSP: 2.0000 | Freq: Every day | NASAL | 6 refills | Status: DC

## 2017-03-21 MED ORDER — AMOXICILLIN-POT CLAVULANATE 875-125 MG PO TABS: 1.0000 | ORAL_TABLET | Freq: Two times a day (BID) | ORAL | 0 refills | Status: DC

## 2017-03-21 MED FILL — predniSONE 10 MG TABS: 10 | 12 days supply | Qty: 20 | Fill #0

## 2017-03-21 MED FILL — AMOXICILLIN-POT CLAVULANATE: 875-125 | 10 days supply | Qty: 20 | Fill #0

## 2017-03-21 NOTE — Patient Instructions (Signed)

## 2017-03-21 NOTE — Progress Notes (Signed)
Subjective:  I acted as a Neurosurgeonscribe for Aon CorporationDr.Lowne Chase Shaneka, RMA   Patient ID: John BundeJoe C Porter, male    DOB: Sep 25, 1985, 32 y.o.   MRN: [id number]  Chief Complaint  Patient presents with  . Cough    HPI  Patient is in today for recurrent cough   X 3 weeks .    He had levaquin from e visit.    Patient Care Team: Corwin LevinsJohn, James W, MD as PCP - General   Past Medical History:  Diagnosis Date  . Hyperlipidemia 06/01/2011    History reviewed. No pertinent surgical history.  Family History  Problem Relation Age of Onset  . Hypertension Mother   . Hypertension Father   . Hyperlipidemia Father   . Asthma Father     Social History   Socioeconomic History  . Marital status: Single    Spouse name: Not on file  . Number of children: Not on file  . Years of education: Not on file  . Highest education level: Not on file  Social Needs  . Financial resource strain: Not on file  . Food insecurity - worry: Not on file  . Food insecurity - inability: Not on file  . Transportation needs - medical: Not on file  . Transportation needs - non-medical: Not on file  Occupational History  . Not on file  Tobacco Use  . Smoking status: Never Smoker  . Smokeless tobacco: Never Used  Substance and Sexual Activity  . Alcohol use: No  . Drug use: No  . Sexual activity: Not on file  Other Topics Concern  . Not on file  Social History Narrative  . Not on file    Outpatient Medications Prior to Visit  Medication Sig Dispense Refill  . cetirizine (ZYRTEC) 10 MG tablet Take 1 tablet (10 mg total) daily by mouth. 30 tablet 11  . levofloxacin (LEVAQUIN) 500 MG tablet Take 1 tablet (500 mg total) by mouth daily. 7 tablet 0   No facility-administered medications prior to visit.     No Known Allergies  Review of Systems  Constitutional: Negative for chills, fever and malaise/fatigue.  HENT: Positive for congestion and sinus pain. Negative for hearing loss.   Eyes: Negative for discharge.    Respiratory: Positive for cough and sputum production. Negative for shortness of breath.   Cardiovascular: Negative for chest pain, palpitations and leg swelling.  Gastrointestinal: Negative for abdominal pain, blood in stool, constipation, diarrhea, heartburn, nausea and vomiting.  Genitourinary: Negative for dysuria, frequency, hematuria and urgency.  Musculoskeletal: Negative for back pain, falls and myalgias.  Skin: Negative for rash.  Neurological: Negative for dizziness, sensory change, loss of consciousness, weakness and headaches.  Endo/Heme/Allergies: Negative for environmental allergies. Does not bruise/bleed easily.  Psychiatric/Behavioral: Negative for depression and suicidal ideas. The patient is not nervous/anxious and does not have insomnia.        Objective:    Physical Exam  Constitutional: He is oriented to person, place, and time. He appears well-developed and well-nourished.  HENT:  Right Ear: External ear normal.  Left Ear: External ear normal.  + PND + errythema  Eyes: Conjunctivae are normal. Right eye exhibits no discharge. Left eye exhibits no discharge.  Cardiovascular: Normal rate, regular rhythm and normal heart sounds.  No murmur heard. Pulmonary/Chest: Effort normal. No respiratory distress. He has decreased breath sounds in the right upper field and the right middle field. He has no wheezes. He has no rales. He exhibits no tenderness.  Musculoskeletal: He exhibits no edema.  Lymphadenopathy:    He has cervical adenopathy.  Neurological: He is alert and oriented to person, place, and time.  Nursing note and vitals reviewed.   BP 121/83 (BP Location: Left Arm, Patient Position: Sitting, Cuff Size: Large)   Pulse (!) 106   Temp 97.8 F (36.6 C) (Oral)   Resp 18   Ht 5\' 9"  (1.753 m)   Wt 291 lb (132 kg)   SpO2 99%   BMI 42.97 kg/m  Wt Readings from Last 3 Encounters:  03/21/17 291 lb (132 kg)  03/02/17 294 lb (133.4 kg)  12/31/16 289 lb 9.6 oz  (131.4 kg)   BP Readings from Last 3 Encounters:  03/21/17 121/83  03/02/17 108/78  12/31/16 (!) 141/89     Immunization History  Administered Date(s) Administered  . Influenza,inj,Quad PF,6+ Mos 12/25/2014, 11/16/2015  . Influenza-Unspecified 12/07/2016  . Td 02/14/2006  . Tdap 03/02/2017    Health Maintenance  Topic Date Due  . Janet Berlin  03/03/2027  . INFLUENZA VACCINE  Completed  . HIV Screening  Completed    Lab Results  Component Value Date   WBC 6.2 03/02/2017   HGB 13.6 03/02/2017   HCT 42.9 03/02/2017   PLT 244.0 03/02/2017   GLUCOSE 140 (H) 03/02/2017   CHOL 193 03/02/2017   TRIG 139.0 03/02/2017   HDL 36.30 (L) 03/02/2017   LDLDIRECT 110.0 03/11/2016   LDLCALC 129 (H) 03/02/2017   ALT 41 03/02/2017   AST 21 03/02/2017   NA 138 03/02/2017   K 3.6 03/02/2017   CL 103 03/02/2017   CREATININE 1.14 03/02/2017   BUN 11 03/02/2017   CO2 25 03/02/2017   TSH 1.29 03/02/2017   HGBA1C 5.9 03/02/2017    Lab Results  Component Value Date   TSH 1.29 03/02/2017   Lab Results  Component Value Date   WBC 6.2 03/02/2017   HGB 13.6 03/02/2017   HCT 42.9 03/02/2017   MCV 72.6 (L) 03/02/2017   PLT 244.0 03/02/2017   Lab Results  Component Value Date   NA 138 03/02/2017   K 3.6 03/02/2017   CO2 25 03/02/2017   GLUCOSE 140 (H) 03/02/2017   BUN 11 03/02/2017   CREATININE 1.14 03/02/2017   BILITOT 0.5 03/02/2017   ALKPHOS 50 03/02/2017   AST 21 03/02/2017   ALT 41 03/02/2017   PROT 7.6 03/02/2017   ALBUMIN 4.6 03/02/2017   CALCIUM 9.4 03/02/2017   GFR 95.44 03/02/2017   Lab Results  Component Value Date   CHOL 193 03/02/2017   Lab Results  Component Value Date   HDL 36.30 (L) 03/02/2017   Lab Results  Component Value Date   LDLCALC 129 (H) 03/02/2017   Lab Results  Component Value Date   TRIG 139.0 03/02/2017   Lab Results  Component Value Date   CHOLHDL 5 03/02/2017   Lab Results  Component Value Date   HGBA1C 5.9 03/02/2017          Assessment & Plan:   Problem List Items Addressed This Visit    None    Visit Diagnoses    Cough    -  Primary   Relevant Medications   predniSONE (DELTASONE) 10 MG tablet   Other Relevant Orders   DG Chest 2 View   Pansinusitis, unspecified chronicity       Relevant Medications   amoxicillin-clavulanate (AUGMENTIN) 875-125 MG tablet   predniSONE (DELTASONE) 10 MG tablet   fluticasone (FLONASE) 50 MCG/ACT  nasal spray   methylPREDNISolone acetate (DEPO-MEDROL) injection 80 mg (Completed)   Bronchitis       Relevant Medications   amoxicillin-clavulanate (AUGMENTIN) 875-125 MG tablet   predniSONE (DELTASONE) 10 MG tablet   fluticasone (FLONASE) 50 MCG/ACT nasal spray   methylPREDNISolone acetate (DEPO-MEDROL) injection 80 mg (Completed)      I have discontinued John Porter's levofloxacin. I am also having him start on amoxicillin-clavulanate, predniSONE, and fluticasone. Additionally, I am having him maintain his cetirizine. We administered methylPREDNISolone acetate.  Meds ordered this encounter  Medications  . amoxicillin-clavulanate (AUGMENTIN) 875-125 MG tablet    Sig: Take 1 tablet by mouth 2 (two) times daily.    Dispense:  20 tablet    Refill:  0  . predniSONE (DELTASONE) 10 MG tablet    Sig: TAKE 3 TABLETS PO QD FOR 3 DAYS THEN TAKE 2 TABLETS PO QD FOR 3 DAYS THEN TAKE 1 TABLET PO QD FOR 3 DAYS THEN TAKE 1/2 TAB PO QD FOR 3 DAYS    Dispense:  20 tablet    Refill:  0  . fluticasone (FLONASE) 50 MCG/ACT nasal spray    Sig: Place 2 sprays into both nostrils daily.    Dispense:  16 g    Refill:  6  . methylPREDNISolone acetate (DEPO-MEDROL) injection 80 mg    CMA served as scribe during this visit. History, Physical and Plan performed by medical provider. Documentation and orders reviewed and attested to.  Donato Schultz, DO

## 2017-03-23 ENCOUNTER — Encounter: Payer: Self-pay | Admitting: Internal Medicine

## 2017-03-23 ENCOUNTER — Ambulatory Visit: Payer: 59 | Admitting: Internal Medicine

## 2017-03-23 ENCOUNTER — Other Ambulatory Visit: Payer: Self-pay | Admitting: *Deleted

## 2017-03-23 ENCOUNTER — Telehealth: Payer: Self-pay | Admitting: Internal Medicine

## 2017-03-23 VITALS — BP 124/82 | HR 105 | Temp 97.6°F | Ht 69.0 in | Wt 294.0 lb

## 2017-03-23 DIAGNOSIS — E785 Hyperlipidemia, unspecified: Secondary | ICD-10-CM | POA: Diagnosis not present

## 2017-03-23 DIAGNOSIS — R739 Hyperglycemia, unspecified: Secondary | ICD-10-CM

## 2017-03-23 DIAGNOSIS — J019 Acute sinusitis, unspecified: Secondary | ICD-10-CM | POA: Diagnosis not present

## 2017-03-23 MED ORDER — HYDROCODONE-HOMATROPINE 5-1.5 MG/5ML PO SYRP: 5.0000 mL | ORAL_SOLUTION | Freq: Four times a day (QID) | ORAL | 0 refills | Status: AC | PRN

## 2017-03-23 MED ORDER — MUCINEX DM 30-600 MG PO TB12: 1.0000 | ORAL_TABLET | Freq: Two times a day (BID) | ORAL | 2 refills | Status: DC

## 2017-03-23 MED ORDER — AZITHROMYCIN 250 MG PO TABS
ORAL_TABLET | ORAL | 0 refills | Status: DC
Start: 2017-03-23 — End: 2017-12-14

## 2017-03-23 MED FILL — AZITHROMYCIN 250 MG TABLET: 250 | 5 days supply | Qty: 6 | Fill #0

## 2017-03-23 MED FILL — HYDROCODONE-HOMATROPINE SOL: 5-1.5 | 9 days supply | Qty: 180 | Fill #0

## 2017-03-23 NOTE — Telephone Encounter (Signed)
Pt has been informed and expressed understanding.  

## 2017-03-23 NOTE — Patient Instructions (Addendum)
Please take all new medication as prescribed - the antibiotic, and cough medicine if needed  Please continue all other medications as before, and refills have been done if requested.  Please have the pharmacy call with any other refills you may need.  Please continue your efforts at being more active, low cholesterol diet, and weight control.  Please keep your appointments with your specialists as you may have planned   

## 2017-03-23 NOTE — Assessment & Plan Note (Addendum)
Mild to mod, for antibx course, cough med prn, to f/u any worsening symptoms or concerns 

## 2017-03-23 NOTE — Telephone Encounter (Signed)
He can actually take both at the same time, but probably only needs to take the hydrocodone cough med to be effective

## 2017-03-23 NOTE — Assessment & Plan Note (Signed)
Goal ldl < 100, for lower chol diet, f/u lab next visit

## 2017-03-23 NOTE — Telephone Encounter (Signed)
Copied from CRM [phone]#[id number]. Topic: Quick Communication - See Telephone Encounter >> Mar 23, 2017 10:11 AM Arlyss Gandyichardson, Ashlye Oviedo N, NT wrote: CRM for notification. See Telephone encounter for: Pt calling and wants to know if he needs to be taking the  Dextromethorphan-Guaifenesin [facility](MUCINEX DM) and the  HYDROcodone-homatropine (HYCODAN)? He was prescribed both these medications but by 2 different physicians. I advised him to not take either until he hears back from the office.   03/23/17.

## 2017-03-23 NOTE — Assessment & Plan Note (Signed)
Mild, suggests pre DM, a1c normal, ok to redouble efforts at diet and exercise and wt control, to f/u any worsening symptoms or concerns

## 2017-03-23 NOTE — Telephone Encounter (Signed)
Patient requested rx for mucinex dm so he can get on benefits card.  Ok per Dr. Laury AxonLowne to send in.

## 2017-03-23 NOTE — Progress Notes (Signed)
Subjective:    Patient ID: John Porter, male    DOB: 1985/03/25, 32 y.o.   MRN: [id number]  HPI   Here with 2-3 days acute onset fever, facial pain, pressure, headache, general weakness and malaise, and greenish d/c, with mild ST and cough, but pt denies chest pain, wheezing, increased sob or doe, orthopnea, PND, increased LE swelling, palpitations, dizziness or syncope.  Was seen a tx a few days ago with augmentin but just not improving, feels terrible, and cant sleep due to cough,  Asks for work note for today.   Pt denies new neurological symptoms such as new headache, or facial or extremity weakness or numbness   Pt denies polydipsia, polyuria.  Has been trying to follow lower cholesterol diet since last visit Past Medical History:  Diagnosis Date  . Hyperlipidemia 06/01/2011   No past surgical history on file.  reports that  has never smoked. he has never used smokeless tobacco. He reports that he does not drink alcohol or use drugs. family history includes Asthma in his father; Hyperlipidemia in his father; Hypertension in his father and mother. No Known Allergies Current Outpatient Medications on File Prior to Visit  Medication Sig Dispense Refill  . cetirizine (ZYRTEC) 10 MG tablet Take 1 tablet (10 mg total) daily by mouth. 30 tablet 11  . fluticasone (FLONASE) 50 MCG/ACT nasal spray Place 2 sprays into both nostrils daily. 16 g 6   No current facility-administered medications on file prior to visit.    Review of Systems  Constitutional: Negative for other unusual diaphoresis or sweats HENT: Negative for ear discharge or swelling Eyes: Negative for other worsening visual disturbances Respiratory: Negative for stridor or other swelling  Gastrointestinal: Negative for worsening distension or other blood Genitourinary: Negative for retention or other urinary change Musculoskeletal: Negative for other MSK pain or swelling Skin: Negative for color change or other new  lesions Neurological: Negative for worsening tremors and other numbness  Psychiatric/Behavioral: Negative for worsening agitation or other fatigue All other system neg per pt    Objective:   Physical Exam BP 124/82   Pulse (!) 105   Temp 97.6 F (36.4 C) (Oral)   Ht 5\' 9"  (1.753 m)   Wt 294 lb (133.4 kg)   SpO2 98%   BMI 43.42 kg/m  VS noted, mild ill Constitutional: Pt appears in NAD HENT: Head: NCAT.  Right Ear: External ear normal.  Left Ear: External ear normal.  Eyes: . Pupils are equal, round, and reactive to light. Conjunctivae and EOM are normal Bilat tm's with mild erythema.  Max sinus areas mild tender.  Pharynx with mild erythema, no exudate  Nose: without d/c or deformity Neck: Neck supple. Gross normal ROM Cardiovascular: Normal rate and regular rhythm.   Pulmonary/Chest: Effort normal and breath sounds without rales or wheezing.  Neurological: Pt is alert. At baseline orientation, motor grossly intact Skin: Skin is warm. No rashes, other new lesions, no LE edema Psychiatric: Pt behavior is normal without agitation  No other exam findings Lab Results  Component Value Date   WBC 6.2 03/02/2017   HGB 13.6 03/02/2017   HCT 42.9 03/02/2017   PLT 244.0 03/02/2017   GLUCOSE 140 (H) 03/02/2017   CHOL 193 03/02/2017   TRIG 139.0 03/02/2017   HDL 36.30 (L) 03/02/2017   LDLDIRECT 110.0 03/11/2016   LDLCALC 129 (H) 03/02/2017   ALT 41 03/02/2017   AST 21 03/02/2017   NA 138 03/02/2017   K 3.6 03/02/2017  CL 103 03/02/2017   CREATININE 1.14 03/02/2017   BUN 11 03/02/2017   CO2 25 03/02/2017   TSH 1.29 03/02/2017   HGBA1C 5.9 03/02/2017       Assessment & Plan:

## 2017-04-14 ENCOUNTER — Other Ambulatory Visit: Payer: Self-pay

## 2017-04-14 ENCOUNTER — Emergency Department (HOSPITAL_BASED_OUTPATIENT_CLINIC_OR_DEPARTMENT_OTHER)
Admission: EM | Admit: 2017-04-14 | Discharge: 2017-04-15 | Disposition: A | Discharge status: Home / Self Care | Payer: 59 | Attending: Emergency Medicine | Admitting: Emergency Medicine

## 2017-04-14 ENCOUNTER — Encounter (HOSPITAL_BASED_OUTPATIENT_CLINIC_OR_DEPARTMENT_OTHER): Payer: Self-pay | Admitting: Emergency Medicine

## 2017-04-14 DIAGNOSIS — B349 Viral infection, unspecified: Secondary | ICD-10-CM | POA: Diagnosis not present

## 2017-04-14 DIAGNOSIS — R112 Nausea with vomiting, unspecified: Secondary | ICD-10-CM | POA: Insufficient documentation

## 2017-04-14 DIAGNOSIS — Z79899 Other long term (current) drug therapy: Secondary | ICD-10-CM | POA: Diagnosis not present

## 2017-04-14 DIAGNOSIS — R197 Diarrhea, unspecified: Secondary | ICD-10-CM

## 2017-04-14 LAB — COMPREHENSIVE METABOLIC PANEL
ALK PHOS: 57 U/L (ref 38–126)
ALT: 43 U/L (ref 17–63)
AST: 28 U/L (ref 15–41)
Albumin: 4.7 g/dL (ref 3.5–5.0)
Anion gap: 10 (ref 5–15)
BUN: 13 mg/dL (ref 6–20)
CALCIUM: 9.2 mg/dL (ref 8.9–10.3)
CHLORIDE: 101 mmol/L (ref 101–111)
CO2: 25 mmol/L (ref 22–32)
CREATININE: 1.3 mg/dL — AB (ref 0.61–1.24)
Glucose, Bld: 115 mg/dL — ABNORMAL HIGH (ref 65–99)
Potassium: 3.9 mmol/L (ref 3.5–5.1)
Sodium: 136 mmol/L (ref 135–145)
Total Bilirubin: 1 mg/dL (ref 0.3–1.2)
Total Protein: 8.7 g/dL — ABNORMAL HIGH (ref 6.5–8.1)

## 2017-04-14 LAB — URINALYSIS, MICROSCOPIC (REFLEX): WBC, UA: NONE SEEN WBC/hpf (ref 0–5)

## 2017-04-14 LAB — CBC WITH DIFFERENTIAL/PLATELET
BASOS ABS: 0 10*3/uL (ref 0.0–0.1)
Basophils Relative: 0 %
Eosinophils Absolute: 0 10*3/uL (ref 0.0–0.7)
Eosinophils Relative: 0 %
HCT: 42.1 % (ref 39.0–52.0)
HEMOGLOBIN: 13.9 g/dL (ref 13.0–17.0)
LYMPHS ABS: 0.9 10*3/uL (ref 0.7–4.0)
LYMPHS PCT: 8 %
MCH: 23.2 pg — AB (ref 26.0–34.0)
MCHC: 33 g/dL (ref 30.0–36.0)
MCV: 70.2 fL — AB (ref 78.0–100.0)
Monocytes Absolute: 0.4 10*3/uL (ref 0.1–1.0)
Monocytes Relative: 4 %
NEUTROS ABS: 9.7 10*3/uL — AB (ref 1.7–7.7)
NEUTROS PCT: 88 %
Platelets: 231 10*3/uL (ref 150–400)
RBC: 6 MIL/uL — AB (ref 4.22–5.81)
RDW: 17.8 % — ABNORMAL HIGH (ref 11.5–15.5)
WBC: 11 10*3/uL — AB (ref 4.0–10.5)

## 2017-04-14 LAB — URINALYSIS, ROUTINE W REFLEX MICROSCOPIC
BILIRUBIN URINE: NEGATIVE
GLUCOSE, UA: NEGATIVE mg/dL
Ketones, ur: NEGATIVE mg/dL
Leukocytes, UA: NEGATIVE
Nitrite: NEGATIVE
Protein, ur: NEGATIVE mg/dL
Specific Gravity, Urine: >1.03 — ABNORMAL HIGH (ref 1.005–1.030)
pH: 6 (ref 5.0–8.0)

## 2017-04-14 LAB — LIPASE, BLOOD: LIPASE: 21 U/L (ref 11–51)

## 2017-04-14 MED ORDER — SODIUM CHLORIDE 0.9 % IV BOLUS (SEPSIS)
1000.0000 mL | Freq: Once | INTRAVENOUS | Status: AC
  Administered 2017-04-14: 1000 mL via INTRAVENOUS

## 2017-04-14 MED ORDER — ACETAMINOPHEN 325 MG PO TABS: 650.0000 mg | ORAL_TABLET | Freq: Once | ORAL | Status: DC

## 2017-04-14 MED ORDER — ACETAMINOPHEN 500 MG PO TABS
1000.0000 mg | ORAL_TABLET | Freq: Once | ORAL | Status: AC
  Administered 2017-04-14: 1000 mg via ORAL
  Filled 2017-04-14: qty 2

## 2017-04-14 MED ORDER — ONDANSETRON HCL 4 MG/2ML IJ SOLN
4.0000 mg | Freq: Once | INTRAMUSCULAR | Status: AC
  Administered 2017-04-14: 4 mg via INTRAVENOUS
  Filled 2017-04-14: qty 2

## 2017-04-14 NOTE — ED Notes (Signed)
States has had 5 diarrhea stools and vomited up dark blood x 1 today. Denies pain but still having nausea and weakness

## 2017-04-14 NOTE — ED Triage Notes (Addendum)
PT presents with c/o abdominal pain vomiting X 3 diarrhea X 15 today that started at 4 am today. No vomiting in ED . Offered patient vomit bag , pt refused.

## 2017-04-14 NOTE — ED Notes (Signed)
Given ginger ale 

## 2017-04-14 NOTE — ED Provider Notes (Signed)
[facility] EMERGENCY DEPARTMENT Provider Note   CSN: [id number] Arrival date & time: 04/14/17  1903     History   Chief Complaint Chief Complaint  Patient presents with  . Abdominal Pain    HPI John Porter is a 32 y.o. male with history of hyperlipidemia who presents with a 1 day history of nausea, vomiting, and diarrhea.  Patient reports he woke up at 4 AM this morning with the symptoms.  He has had 5-6 episodes of nonbloody diarrhea.  He has had 3 episodes of emesis.  He reports seeing bright red blood in the last one.  He has not vomited since 2 PM today.  He is kept down some fluids today.  He is not tried to eat anything.  He reports having [place] food that was questionable last evening.  No other sick contacts at home.  He also reports that he has been on 2 courses of antibiotics this past month for upper respiratory infection.  He denies abdominal pain, chest pain, shortness of breath, fevers.  Patient denies daily alcohol use.  He reports he has 2 or 3 shots of liquor per week.  HPI  Past Medical History:  Diagnosis Date  . Hyperlipidemia 06/01/2011    Patient Active Problem List   Diagnosis Date Noted  . Acute sinus infection 03/23/2017  . Flu-like symptoms 12/29/2016  . Hyperglycemia 03/11/2016  . Possible exposure to STD 01/14/2014  . Hyperlipidemia 06/01/2011  . Genital herpes 05/17/2011  . Preventative health care 05/12/2011    History reviewed. No pertinent surgical history.     Home Medications    Prior to Admission medications   Medication Sig Start Date End Date Taking? Authorizing Provider  azithromycin (ZITHROMAX Z-PAK) 250 MG tablet 2 tab by mouth day 1, then 1 per day 03/23/17   Corwin LevinsJohn, James W, MD  cetirizine (ZYRTEC) 10 MG tablet Take 1 tablet (10 mg total) daily by mouth. 12/31/16   Doristine BosworthStallings, Zoe A, MD  Dextromethorphan-Guaifenesin (MUCINEX DM) 30-600 MG TB12 Take 1 tablet by mouth 2 (two) times daily. 03/23/17   Seabron SpatesLowne Chase, Yvonne R, DO    fluticasone (FLONASE) 50 MCG/ACT nasal spray Place 2 sprays into both nostrils daily. 03/21/17   Seabron SpatesLowne Chase, Yvonne R, DO  ondansetron (ZOFRAN) 4 MG tablet Take 1 tablet (4 mg total) by mouth every 6 (six) hours. 04/15/17   Theda Payer, Waylan BogaAlexandra M, PA-C  pantoprazole (PROTONIX) 20 MG tablet Take 1 tablet (20 mg total) by mouth daily. 04/15/17   Emi HolesLaw, Americus Scheurich M, PA-C    Family History Family History  Problem Relation Age of Onset  . Hypertension Mother   . Hypertension Father   . Hyperlipidemia Father   . Asthma Father     Social History Social History   Tobacco Use  . Smoking status: Never Smoker  . Smokeless tobacco: Never Used  Substance Use Topics  . Alcohol use: No  . Drug use: No     Allergies   Patient has no known allergies.   Review of Systems Review of Systems  Constitutional: Negative for chills and fever.  HENT: Negative for facial swelling and sore throat.   Respiratory: Negative for shortness of breath.   Cardiovascular: Negative for chest pain.  Gastrointestinal: Positive for diarrhea, nausea and vomiting. Negative for abdominal pain and blood in stool.  Genitourinary: Negative for dysuria.  Musculoskeletal: Negative for back pain.  Skin: Negative for rash and wound.  Neurological: Negative for headaches.  Psychiatric/Behavioral: The patient  is not nervous/anxious.      Physical Exam Updated Vital Signs BP 130/70 (BP Location: Right Arm)   Pulse 99   Temp (!) 100.4 F (38 C) (Oral)   Resp 18   Ht 5\' 8"  (1.727 m)   Wt 131.5 kg (290 lb)   SpO2 100%   BMI 44.09 kg/m   Physical Exam  Constitutional: He appears well-developed and well-nourished. No distress.  HENT:  Head: Normocephalic and atraumatic.  Mouth/Throat: Oropharynx is clear and moist. No oropharyngeal exudate.  Eyes: Conjunctivae are normal. Pupils are equal, round, and reactive to light. Right eye exhibits no discharge. Left eye exhibits no discharge. No scleral icterus.  Neck: Normal range  of motion. Neck supple. No thyromegaly present.  Cardiovascular: Normal rate, regular rhythm, normal heart sounds and intact distal pulses. Exam reveals no gallop and no friction rub.  No murmur heard. Pulmonary/Chest: Effort normal and breath sounds normal. No stridor. No respiratory distress. He has no wheezes. He has no rales.  Abdominal: Soft. Bowel sounds are normal. He exhibits no distension. There is no tenderness. There is no rebound and no guarding.  Musculoskeletal: He exhibits no edema.  Lymphadenopathy:    He has no cervical adenopathy.  Neurological: He is alert. Coordination normal.  Skin: Skin is warm and dry. No rash noted. He is not diaphoretic. No pallor.  Psychiatric: He has a normal mood and affect.  Nursing note and vitals reviewed.    ED Treatments / Results  Labs (all labs ordered are listed, but only abnormal results are displayed) Labs Reviewed  CBC WITH DIFFERENTIAL/PLATELET - Abnormal; Notable for the following components:      Result Value   WBC 11.0 (*)    RBC 6.00 (*)    MCV 70.2 (*)    MCH 23.2 (*)    RDW 17.8 (*)    Neutro Abs 9.7 (*)    All other components within normal limits  COMPREHENSIVE METABOLIC PANEL - Abnormal; Notable for the following components:   Glucose, Bld 115 (*)    Creatinine, Ser 1.30 (*)    Total Protein 8.7 (*)    All other components within normal limits  URINALYSIS, ROUTINE W REFLEX MICROSCOPIC - Abnormal; Notable for the following components:   Specific Gravity, Urine >1.030 (*)    Hgb urine dipstick TRACE (*)    All other components within normal limits  URINALYSIS, MICROSCOPIC (REFLEX) - Abnormal; Notable for the following components:   Bacteria, UA RARE (*)    Squamous Epithelial / LPF 0-5 (*)    All other components within normal limits  LIPASE, BLOOD    EKG  EKG Interpretation None       Radiology No results found.  Procedures Procedures (including critical care time)  Medications Ordered in  ED Medications  sodium chloride 0.9 % bolus 1,000 mL (0 mLs Intravenous Stopped 04/14/17 2301)  ondansetron (ZOFRAN) injection 4 mg (4 mg Intravenous Given 04/14/17 2229)  acetaminophen (TYLENOL) tablet 1,000 mg (1,000 mg Oral Given 04/14/17 2350)     Initial Impression / Assessment and Plan / ED Course  I have reviewed the triage vital signs and the nursing notes.  Pertinent labs & imaging results that were available during my care of the patient were reviewed by me and considered in my medical decision making (see chart for details).     Patient with nausea, vomiting, diarrhea.  Patient developed fever in the emergency department.  Suspect viral etiology.  Patient with small amount of  hematemesis.  Suspect Mallory-Weiss tear none occurred since single episode of hematemesis.  Mild leukocytosis, 11.0.  Mild AKI, creatinine 1.30.  Patient given 1 L of fluids and Zofran and feeling much better.  Patient tolerating oral fluids prior to discharge.  No focal tenderness.  Patient given Tylenol 1000 mg prior to discharge for fever.  Patient discharged with Protonix, Zofran.  Follow-up to PCP in 2 days for recheck.  Strict return precautions given.  Patient vitals stable and discharged in satisfactory condition.  Final Clinical Impressions(s) / ED Diagnoses   Final diagnoses:  Nausea vomiting and diarrhea  Viral syndrome    ED Discharge Orders        Ordered    ondansetron (ZOFRAN) 4 MG tablet  Every 6 hours,   Status:  Discontinued     04/15/17 0015    pantoprazole (PROTONIX) 20 MG tablet  Daily     04/15/17 0017    ondansetron (ZOFRAN) 4 MG tablet  Every 6 hours     04/15/17 0018       Emi Holes, PA-C 04/15/17 1610    Tilden Fossa, MD 04/15/17 [phone]

## 2017-04-15 MED ORDER — PANTOPRAZOLE SODIUM 20 MG PO TBEC: 20.0000 mg | DELAYED_RELEASE_TABLET | Freq: Every day | ORAL | 0 refills | Status: DC

## 2017-04-15 MED ORDER — ONDANSETRON HCL 4 MG PO TABS: 4.0000 mg | ORAL_TABLET | Freq: Four times a day (QID) | ORAL | 0 refills | Status: DC

## 2017-04-15 NOTE — Discharge Instructions (Signed)
Medications: Zofran, Protonix  Treatment: Take Zofran every 6 hours as needed for nausea or vomiting.  Take Protonix once daily to help with irritation of your stomach lining.  You can alternate Motrin and Tylenol as prescribed over-the-counter, as needed for fever.  Make sure to drink plenty of fluids.  Avoid greasy, fatty, fried foods until you are feeling much better.  Start with clear liquids and progress to bland foods such as bananas, rice, applesauce, toast, baked chicken.  Follow-up: Please follow-up with your doctor on Monday if your symptoms are continuing.  Please return to the emergency department if you develop any new or worsening symptoms including intractable vomiting, vomiting or defecating large amounts of blood, severe localized abdominal pain, or any other new or concerning symptom.

## 2017-06-30 ENCOUNTER — Encounter: Payer: Self-pay | Admitting: Family Medicine

## 2017-07-05 ENCOUNTER — Encounter: Payer: Self-pay | Admitting: Family Medicine

## 2017-11-08 ENCOUNTER — Telehealth: Payer: 59 | Admitting: Family Medicine

## 2017-11-08 DIAGNOSIS — J329 Chronic sinusitis, unspecified: Secondary | ICD-10-CM

## 2017-11-08 MED ORDER — AMOXICILLIN-POT CLAVULANATE 875-125 MG PO TABS: 1.0000 | ORAL_TABLET | Freq: Two times a day (BID) | ORAL | 0 refills | Status: AC

## 2017-11-08 NOTE — Progress Notes (Signed)

## 2017-12-14 ENCOUNTER — Ambulatory Visit (INDEPENDENT_AMBULATORY_CARE_PROVIDER_SITE_OTHER): Payer: 59 | Admitting: Family Medicine

## 2017-12-14 ENCOUNTER — Encounter: Payer: Self-pay | Admitting: Family Medicine

## 2017-12-14 VITALS — BP 122/82 | HR 92 | Temp 98.1°F | Ht 67.0 in | Wt 289.0 lb

## 2017-12-14 DIAGNOSIS — L03012 Cellulitis of left finger: Secondary | ICD-10-CM | POA: Diagnosis not present

## 2017-12-14 MED ORDER — DOXYCYCLINE HYCLATE 100 MG PO TABS: 100.0000 mg | ORAL_TABLET | Freq: Two times a day (BID) | ORAL | 0 refills | Status: AC

## 2017-12-14 MED FILL — DOXYCYCLINE HYCLATE 100 MG: 100 | 7 days supply | Qty: 14 | Fill #0

## 2017-12-14 NOTE — Progress Notes (Signed)
Chief Complaint  Patient presents with  . Hand Pain    John Porter is a 32 y.o. male here for a skin complaint.  Duration: 1 week Location:L middle finger Pruritic? No Painful? Yes Drainage? No New soaps/lotions/topicals/detergents? No Sick contacts? No Other associated symptoms: Denies fevers Therapies tried thus far: None  ROS:  Const: No fevers Skin: As noted in HPI  Past Medical History:  Diagnosis Date  . Hyperlipidemia 06/01/2011    BP 122/82 (BP Location: Left Arm, Patient Position: Sitting, Cuff Size: Large)   Pulse 92   Temp 98.1 F (36.7 C) (Oral)   Ht 5\' 7"  (1.702 m)   Wt 289 lb (131.1 kg)   SpO2 98%   BMI 45.26 kg/m  Gen: awake, alert, appearing stated age Lungs: No accessory muscle use Skin: periungual skin is erythematous w fluctuance and ttp, +warmth. No drainage, streaking redness or excoriation. Psych: Age appropriate judgment and insight  Procedure note; incision and drainage Informed consent obtained. The prox portion of middle finger was cleaned with alcohol The ring block was anesthetized with 2.5 mL of 2% lidocaine without epinephrine. The remaining 0.5 mL was used as local.  Once adequate anesthesia was obtained, a cruciate incision was made with 11 blade scalpel. Approximately 0.5 mL of purulent material was found, with lots of blood being expressed. The area was then dressed with gauze. There were no complications noted. The patient tolerated the procedure well.   Paronychia of left middle finger - Plan: doxycycline (VIBRA-TABS) 100 MG tablet, PR DRAIN SKIN ABSCESS SIMPLE  Unsatisfying I&D compared to what we were expecting. Start abx in light of this. Stop biting nails. Ibuprofen, ice, Tylenol, TAO.  Aftercare instructions verbalized and written down. F/u prn. The patient voiced understanding and agreement to the plan.  Jilda [place], DO 12/14/17 9:37 AM

## 2017-12-14 NOTE — Patient Instructions (Addendum)
Stop biting the hang nail.  Ice/cold pack over area for 10-15 min twice daily.  Ibuprofen 400-600 mg (2-3 over the counter strength tabs) every 6 hours as needed for pain.  OK to take Tylenol 1000 mg (2 extra strength tabs) or 975 mg (3 regular strength tabs) every 6 hours as needed.  When you do wash it, use only soap and water. Do not vigorously scrub. Apply triple antibiotic ointment (like Neosporin) twice daily. Keep the area clean and dry.   Things to look out for: increasing pain not relieved by ibuprofen/acetaminophen, fevers, spreading redness, drainage of pus, or foul odor.  Let us know if you need anything.

## 2017-12-14 NOTE — Progress Notes (Signed)
Pre visit review using our clinic review tool, if applicable. No additional management support is needed unless otherwise documented below in the visit note. 

## 2018-02-25 ENCOUNTER — Other Ambulatory Visit: Payer: Self-pay

## 2018-02-25 ENCOUNTER — Encounter: Payer: Self-pay | Admitting: Emergency Medicine

## 2018-02-25 ENCOUNTER — Emergency Department (INDEPENDENT_AMBULATORY_CARE_PROVIDER_SITE_OTHER)
Admission: EM | Admit: 2018-02-25 | Discharge: 2018-02-25 | Disposition: A | Discharge status: Home / Self Care | Payer: 59 | Source: Home / Self Care | Attending: Family Medicine | Admitting: Family Medicine

## 2018-02-25 DIAGNOSIS — B9789 Other viral agents as the cause of diseases classified elsewhere: Secondary | ICD-10-CM | POA: Diagnosis not present

## 2018-02-25 DIAGNOSIS — J029 Acute pharyngitis, unspecified: Secondary | ICD-10-CM

## 2018-02-25 DIAGNOSIS — J069 Acute upper respiratory infection, unspecified: Secondary | ICD-10-CM | POA: Diagnosis not present

## 2018-02-25 NOTE — ED Triage Notes (Signed)
Here with cough, congestion, runny nose and sore throat x 3 days. No fever,chills

## 2018-02-25 NOTE — Discharge Instructions (Signed)
  You may take 500mg acetaminophen every 4-6 hours or in combination with ibuprofen 400-600mg every 6-8 hours as needed for pain, inflammation, and fever.  Be sure to well hydrated with clear liquids and get at least 8 hours of sleep at night, preferably more while sick.   Please follow up with family medicine in 1 week if needed.   

## 2018-02-25 NOTE — ED Provider Notes (Signed)
[name]    CSN: [id number] Arrival date & time: 02/25/18  1430     History   Chief Complaint Chief Complaint  Patient presents with  . Cough  . Sore Throat    HPI John Porter is a 33 y.o. male.   HPI  John Porter is a 33 y.o. male presenting to UC with c/o cough, congestion, rhinorrhea and sore throat for 3 days. Throat pain is most bothersome, worse with swallowing. Severe when trying to swallow food.  Denies fever, chills, n/v/d. No known sick contacts.    Past Medical History:  Diagnosis Date  . Hyperlipidemia 06/01/2011    Patient Active Problem List   Diagnosis Date Noted  . Acute sinus infection 03/23/2017  . Flu-like symptoms 12/29/2016  . Hyperglycemia 03/11/2016  . Possible exposure to STD 01/14/2014  . Hyperlipidemia 06/01/2011  . Genital herpes 05/17/2011  . Preventative health care 05/12/2011    History reviewed. No pertinent surgical history.     Home Medications    Prior to Admission medications   Not on File    Family History Family History  Problem Relation Age of Onset  . Hypertension Mother   . Hypertension Father   . Hyperlipidemia Father   . Asthma Father     Social History Social History   Tobacco Use  . Smoking status: Never Smoker  . Smokeless tobacco: Never Used  Substance Use Topics  . Alcohol use: No  . Drug use: No     Allergies   Patient has no known allergies.   Review of Systems Review of Systems  Constitutional: Negative for chills and fever.  HENT: Positive for congestion and sore throat. Negative for ear pain, trouble swallowing and voice change.   Respiratory: Positive for cough ( dry). Negative for shortness of breath.   Cardiovascular: Negative for chest pain and palpitations.  Gastrointestinal: Negative for abdominal pain, diarrhea, nausea and vomiting.  Musculoskeletal: Negative for arthralgias, back pain and myalgias.  Skin: Negative for rash.     Physical Exam Triage Vital  Signs ED Triage Vitals [02/25/18 1502]  Enc Vitals Group     BP 128/85     Pulse Rate 98     Resp      Temp 98 F (36.7 C)     Temp Source Oral     SpO2 97 %     Weight 286 lb (129.7 kg)     Height 5\' 7"  (1.702 m)     Head Circumference      Peak Flow      Pain Score 0     Pain Loc      Pain Edu?      Excl. in GC?    No data found.  Updated Vital Signs BP 128/85 (BP Location: Left Arm)   Pulse 98   Temp 98 F (36.7 C) (Oral)   Ht 5\' 7"  (1.702 m)   Wt 286 lb (129.7 kg)   SpO2 97%   BMI 44.79 kg/m   Visual Acuity Right Eye Distance:   Left Eye Distance:   Bilateral Distance:    Right Eye Near:   Left Eye Near:    Bilateral Near:     Physical Exam Vitals signs and nursing note reviewed.  Constitutional:      Appearance: He is well-developed.  HENT:     Head: Normocephalic and atraumatic.     Right Ear: Tympanic membrane normal.     Left Ear: Tympanic  membrane normal.     Nose: Nose normal.     Right Sinus: No maxillary sinus tenderness or frontal sinus tenderness.     Left Sinus: No maxillary sinus tenderness or frontal sinus tenderness.     Mouth/Throat:     Lips: Pink.     Mouth: Mucous membranes are moist.     Pharynx: Oropharynx is clear. Uvula midline. Posterior oropharyngeal erythema present. No pharyngeal swelling, oropharyngeal exudate or uvula swelling.  Neck:     Musculoskeletal: Normal range of motion and neck supple.  Cardiovascular:     Rate and Rhythm: Normal rate and regular rhythm.  Pulmonary:     Effort: Pulmonary effort is normal. No respiratory distress.     Breath sounds: Normal breath sounds. No stridor. No wheezing or rhonchi.  Musculoskeletal: Normal range of motion.  Lymphadenopathy:     Cervical: No cervical adenopathy.  Skin:    General: Skin is warm and dry.  Neurological:     Mental Status: He is alert and oriented to person, place, and time.  Psychiatric:        Behavior: Behavior normal.      UC Treatments /  Results  Labs (all labs ordered are listed, but only abnormal results are displayed) Labs Reviewed  STREP A DNA PROBE  POCT RAPID STREP A (OFFICE)    EKG None  Radiology No results found.  Procedures Procedures (including critical care time)  Medications Ordered in UC Medications - No data to display  Initial Impression / Assessment and Plan / UC Course  I have reviewed the triage vital signs and the nursing notes.  Pertinent labs & imaging results that were available during my care of the patient were reviewed by me and considered in my medical decision making (see chart for details).     Rapid strep: NEGATIVE Hx and exam c/w viral illness Encouraged symptomatic tx  Final Clinical Impressions(s) / UC Diagnoses   Final diagnoses:  Viral URI with cough  Sore throat     Discharge Instructions      You may take $RemoveBef [id number]$500mgith ibuprofen 40600mg  every 6-8 hours as needed for pain, inflammation, and fever.  Be sure to well hydrated with clear liquids and get at least 8 hours of sleep at night, preferably more while sick.   Please follow up with family medicine in 1 week if needed.     ED Prescriptions    None     Controlled Substance Prescriptions [place] Controlled Substance Registry consulted? Not Applicable   Rolla Platehelps, Oluwadara Gorman O, PA-C 02/25/18 1536

## 2018-02-26 ENCOUNTER — Telehealth: Payer: Self-pay

## 2018-02-26 LAB — STREP A DNA PROBE: Group A Strep Probe: NOT DETECTED

## 2018-02-26 NOTE — Telephone Encounter (Signed)
Spoke with patient, and given lab results.  Will follow up as needed.

## 2018-06-04 ENCOUNTER — Ambulatory Visit (INDEPENDENT_AMBULATORY_CARE_PROVIDER_SITE_OTHER): Payer: 59 | Admitting: Internal Medicine

## 2018-06-04 ENCOUNTER — Encounter: Payer: Self-pay | Admitting: Internal Medicine

## 2018-06-04 DIAGNOSIS — R739 Hyperglycemia, unspecified: Secondary | ICD-10-CM

## 2018-06-04 DIAGNOSIS — J309 Allergic rhinitis, unspecified: Secondary | ICD-10-CM | POA: Diagnosis not present

## 2018-06-04 MED ORDER — PREDNISONE 10 MG PO TABS: ORAL_TABLET | ORAL | 0 refills | Status: DC

## 2018-06-04 MED ORDER — CETIRIZINE HCL 10 MG PO TABS: 10.0000 mg | ORAL_TABLET | Freq: Every day | ORAL | 11 refills | Status: DC

## 2018-06-04 MED ORDER — TRIAMCINOLONE ACETONIDE 55 MCG/ACT NA AERO: 2.0000 | INHALATION_SPRAY | Freq: Every day | NASAL | 12 refills | Status: DC

## 2018-06-04 NOTE — Assessment & Plan Note (Signed)
stable overall by history and exam, recent data reviewed with pt, and pt to continue medical treatment as before,  to f/u any worsening symptoms or concerns  

## 2018-06-04 NOTE — Assessment & Plan Note (Signed)
Mild to mod, for zyrtec, nasacort and predpac asd, to f/u any worsening symptoms or concerns

## 2018-06-04 NOTE — Patient Instructions (Signed)
Please take all new medication as prescribed  Please continue all other medications as before, and refills have been done if requested.  Please have the pharmacy call with any other refills you may need.  Please continue your efforts at being more active, low cholesterol diet, and weight control.  Please keep your appointments with your specialists as you may have planned    

## 2018-06-04 NOTE — Progress Notes (Signed)
Patient ID: John Porter, male   DOB: January 20, 1986, 33 y.o.   MRN: [id number]  Virtual Visit via Video Note  I connected with [name] on 06/04/18 at  1:40 PM EDT by a video enabled telemedicine application and verified that I am speaking with the correct person using two identifiers. Pt is at home, I am at the office, no other persons present   I discussed the limitations of evaluation and management by telemedicine and the availability of in person appointments. The patient expressed understanding and agreed to proceed.  History of Present Illness: Here with c/o several wks ongoing nasal allergy symptoms with clearish congestion, itch and sneezing, without fever, pain, ST, cough, swelling or wheezing.   Pt denies fever, wt loss, night sweats, loss of appetite, or other constitutional symptoms  Pt denies chest pain, increased sob or doe, wheezing, orthopnea, PND, increased LE swelling, palpitations, dizziness or syncope.  Pt denies new neurological symptoms such as new headache, or facial or extremity weakness or numbness   Pt denies polydipsia, polyuria Past Medical History:  Diagnosis Date  . Hyperlipidemia 06/01/2011   No past surgical history on file.  reports that he has never smoked. He has never used smokeless tobacco. He reports that he does not drink alcohol or use drugs. family history includes Asthma in his father; Hyperlipidemia in his father; Hypertension in his father and mother. No Known Allergies No current outpatient medications on file prior to visit.   No current facility-administered medications on file prior to visit.    Observations/Objective: Alert, NAD, mentating well, not ill appearing, cn 2-12 intact, moves all 4s, no rash or swelling Lab Results  Component Value Date   WBC 11.0 (H) 04/14/2017   HGB 13.9 04/14/2017   HCT 42.1 04/14/2017   PLT 231 04/14/2017   GLUCOSE 115 (H) 04/14/2017   CHOL 193 03/02/2017   TRIG 139.0 03/02/2017   HDL 36.30 (L) 03/02/2017    LDLDIRECT 110.0 03/11/2016   LDLCALC 129 (H) 03/02/2017   ALT 43 04/14/2017   AST 28 04/14/2017   NA 136 04/14/2017   K 3.9 04/14/2017   CL 101 04/14/2017   CREATININE 1.30 (H) 04/14/2017   BUN 13 04/14/2017   CO2 25 04/14/2017   TSH 1.29 03/02/2017   HGBA1C 5.9 03/02/2017   Assessment and Plan: See notes  Follow Up Instructions: See notes   I discussed the assessment and treatment plan with the patient. The patient was provided an opportunity to ask questions and all were answered. The patient agreed with the plan and demonstrated an understanding of the instructions.   The patient was advised to call back or seek an in-person evaluation if the symptoms worsen or if the condition fails to improve as anticipated.  Oliver Barre, MD

## 2018-09-12 ENCOUNTER — Other Ambulatory Visit (INDEPENDENT_AMBULATORY_CARE_PROVIDER_SITE_OTHER): Payer: 59

## 2018-09-12 ENCOUNTER — Telehealth: Payer: Self-pay

## 2018-09-12 ENCOUNTER — Ambulatory Visit (INDEPENDENT_AMBULATORY_CARE_PROVIDER_SITE_OTHER): Payer: 59 | Admitting: Internal Medicine

## 2018-09-12 ENCOUNTER — Other Ambulatory Visit: Payer: Self-pay | Admitting: Internal Medicine

## 2018-09-12 ENCOUNTER — Encounter: Payer: Self-pay | Admitting: Internal Medicine

## 2018-09-12 ENCOUNTER — Other Ambulatory Visit: Payer: Self-pay

## 2018-09-12 VITALS — BP 126/84 | HR 115 | Temp 98.0°F | Ht 67.0 in | Wt 305.0 lb

## 2018-09-12 DIAGNOSIS — E559 Vitamin D deficiency, unspecified: Secondary | ICD-10-CM

## 2018-09-12 DIAGNOSIS — R739 Hyperglycemia, unspecified: Secondary | ICD-10-CM | POA: Diagnosis not present

## 2018-09-12 DIAGNOSIS — E611 Iron deficiency: Secondary | ICD-10-CM | POA: Diagnosis not present

## 2018-09-12 DIAGNOSIS — Z0000 Encounter for general adult medical examination without abnormal findings: Secondary | ICD-10-CM | POA: Diagnosis not present

## 2018-09-12 DIAGNOSIS — E538 Deficiency of other specified B group vitamins: Secondary | ICD-10-CM

## 2018-09-12 LAB — HEPATIC FUNCTION PANEL
ALT: 39 U/L (ref 0–53)
AST: 21 U/L (ref 0–37)
Albumin: 4.4 g/dL (ref 3.5–5.2)
Alkaline Phosphatase: 50 U/L (ref 39–117)
Bilirubin, Direct: 0.1 mg/dL (ref 0.0–0.3)
Total Bilirubin: 0.5 mg/dL (ref 0.2–1.2)
Total Protein: 7.6 g/dL (ref 6.0–8.3)

## 2018-09-12 LAB — BASIC METABOLIC PANEL
BUN: 12 mg/dL (ref 6–23)
CO2: 26 mEq/L (ref 19–32)
Calcium: 9.6 mg/dL (ref 8.4–10.5)
Chloride: 103 mEq/L (ref 96–112)
Creatinine, Ser: 1.19 mg/dL (ref 0.40–1.50)
GFR: 84.66 mL/min (ref >60.00)
Glucose, Bld: 90 mg/dL (ref 70–99)
Potassium: 4 mEq/L (ref 3.5–5.1)
Sodium: 138 mEq/L (ref 135–145)

## 2018-09-12 LAB — URINALYSIS, ROUTINE W REFLEX MICROSCOPIC
Bilirubin Urine: NEGATIVE
Hgb urine dipstick: NEGATIVE
Ketones, ur: NEGATIVE
Leukocytes,Ua: NEGATIVE
Nitrite: NEGATIVE
RBC / HPF: NONE SEEN (ref 0–?)
Specific Gravity, Urine: 1.02 (ref 1.000–1.030)
Total Protein, Urine: NEGATIVE
Urine Glucose: NEGATIVE
Urobilinogen, UA: 1 (ref 0.0–1.0)
pH: 8 (ref 5.0–8.0)

## 2018-09-12 LAB — VITAMIN B12: Vitamin B-12: 210 pg/mL — ABNORMAL LOW (ref 211–911)

## 2018-09-12 LAB — CBC WITH DIFFERENTIAL/PLATELET
Basophils Absolute: 0.1 10*3/uL (ref 0.0–0.1)
Basophils Relative: 0.7 % (ref 0.0–3.0)
Eosinophils Absolute: 0.2 10*3/uL (ref 0.0–0.7)
Eosinophils Relative: 1.9 % (ref 0.0–5.0)
HCT: 42.3 % (ref 39.0–52.0)
Hemoglobin: 13.5 g/dL (ref 13.0–17.0)
Lymphocytes Relative: 34.3 % (ref 12.0–46.0)
Lymphs Abs: 2.8 10*3/uL (ref 0.7–4.0)
MCHC: 32 g/dL (ref 30.0–36.0)
MCV: 72.6 fl — ABNORMAL LOW (ref 78.0–100.0)
Monocytes Absolute: 0.5 10*3/uL (ref 0.1–1.0)
Monocytes Relative: 5.7 % (ref 3.0–12.0)
Neutro Abs: 4.7 10*3/uL (ref 1.4–7.7)
Neutrophils Relative %: 57.4 % (ref 43.0–77.0)
Platelets: 262 10*3/uL (ref 150.0–400.0)
RBC: 5.83 Mil/uL — ABNORMAL HIGH (ref 4.22–5.81)
RDW: 14.7 % (ref 11.5–15.5)
WBC: 8.3 10*3/uL (ref 4.0–10.5)

## 2018-09-12 LAB — HEMOGLOBIN A1C: Hgb A1c MFr Bld: 5.9 % (ref 4.6–6.5)

## 2018-09-12 LAB — LIPID PANEL
Cholesterol: 190 mg/dL (ref 0–200)
HDL: 34.1 mg/dL — ABNORMAL LOW (ref >39.00)
NonHDL: 155.83
Total CHOL/HDL Ratio: 6
Triglycerides: 211 mg/dL — ABNORMAL HIGH (ref 0.0–149.0)
VLDL: 42.2 mg/dL — ABNORMAL HIGH (ref 0.0–40.0)

## 2018-09-12 LAB — TSH: TSH: 1.6 u[IU]/mL (ref 0.35–4.50)

## 2018-09-12 LAB — LDL CHOLESTEROL, DIRECT: Direct LDL: 133 mg/dL

## 2018-09-12 LAB — VITAMIN D 25 HYDROXY (VIT D DEFICIENCY, FRACTURES): VITD: 7.9 ng/mL — ABNORMAL LOW (ref 30.00–100.00)

## 2018-09-12 LAB — IBC PANEL
Iron: 74 ug/dL (ref 42–165)
Saturation Ratios: 21.6 % (ref 20.0–50.0)
Transferrin: 245 mg/dL (ref 212.0–360.0)

## 2018-09-12 MED ORDER — VITAMIN D (ERGOCALCIFEROL) 1.25 MG (50000 UNIT) PO CAPS: 50000.0000 [IU] | ORAL_CAPSULE | ORAL | 0 refills | Status: DC

## 2018-09-12 MED FILL — VIT D2 1.25 MG (50,000 UNIT: 1.25 MG | 84 days supply | Qty: 12 | Fill #0

## 2018-09-12 NOTE — Assessment & Plan Note (Signed)

## 2018-09-12 NOTE — Telephone Encounter (Signed)
-----   Message from Biagio Borg, MD sent at 09/12/2018  1:13 PM EDT ----- Left message on MyChart, pt to cont same tx except  The test results show that your current treatment is OK, as the tests are stable, except for the mild elevated LDL cholesterol, a low Vitamin D level, and a low Vitamin B12 level  We need to: 1)  Follow a lower cholesterol diet 2)  Please take Vitamin D 50000 units weekly for 12 weeks, then plan to change to OTC Vitamin D3 at 2000 units per day, indefinitely. 3)  Start monthly b12 shots for 6 months, then change to OTC B12 pills daily after that.    Lindalee Huizinga to please inform pt, I will do rx, and to assist with b12 shots

## 2018-09-12 NOTE — Assessment & Plan Note (Signed)
stable overall by history and exam, recent data reviewed with pt, and pt to continue medical treatment as before,  to f/u any worsening symptoms or concerns  

## 2018-09-12 NOTE — Patient Instructions (Signed)

## 2018-09-12 NOTE — Progress Notes (Signed)
Subjective:    Patient ID: John Porter, male    DOB: 1985/03/09, 33 y.o.   MRN: [id number]  HPI  Here for wellness and f/u;  Overall doing ok;  Pt denies Chest pain, worsening SOB, DOE, wheezing, orthopnea, PND, worsening LE edema, palpitations, dizziness or syncope.  Pt denies neurological change such as new headache, facial or extremity weakness.  Pt denies polydipsia, polyuria, or low sugar symptoms. Pt states overall good compliance with treatment and medications, good tolerability, and has been trying to follow appropriate diet.  Pt denies worsening depressive symptoms, suicidal ideation or panic. No fever, night sweats, wt loss, loss of appetite, or other constitutional symptoms.  Pt states good ability with ADL's, has low fall risk, home safety reviewed and adequate, no other significant changes in hearing or vision, and only occasionally active with exercise.  Working 4 x 12 hr days per wk, mostly sitting. Wt Readings from Last 3 Encounters:  09/12/18 (!) 305 lb (138.3 kg)  02/25/18 286 lb (129.7 kg)  12/14/17 289 lb (131.1 kg)   Past Medical History:  Diagnosis Date  . Hyperlipidemia 06/01/2011   No past surgical history on file.  reports that he has never smoked. He has never used smokeless tobacco. He reports that he does not drink alcohol or use drugs. family history includes Asthma in his father; Hyperlipidemia in his father; Hypertension in his father and mother. No Known Allergies Current Outpatient Medications on File Prior to Visit  Medication Sig Dispense Refill  . cetirizine (ZYRTEC) 10 MG tablet Take 1 tablet (10 mg total) by mouth daily. 30 tablet 11  . triamcinolone (NASACORT) 55 MCG/ACT AERO nasal inhaler Place 2 sprays into the nose daily. 1 Inhaler 12   No current facility-administered medications on file prior to visit.    Review of Systems Constitutional: Negative for other unusual diaphoresis, sweats, appetite or weight changes HENT: Negative for other  worsening hearing loss, ear pain, facial swelling, mouth sores or neck stiffness.   Eyes: Negative for other worsening pain, redness or other visual disturbance.  Respiratory: Negative for other stridor or swelling Cardiovascular: Negative for other palpitations or other chest pain  Gastrointestinal: Negative for worsening diarrhea or loose stools, blood in stool, distention or other pain Genitourinary: Negative for hematuria, flank pain or other change in urine volume.  Musculoskeletal: Negative for myalgias or other joint swelling.  Skin: Negative for other color change, or other wound or worsening drainage.  Neurological: Negative for other syncope or numbness. Hematological: Negative for other adenopathy or swelling Psychiatric/Behavioral: Negative for hallucinations, other worsening agitation, SI, self-injury, or new decreased concentration\All other system neg per pt    Objective:   Physical Exam BP 126/84   Pulse (!) 115   Temp 98 F (36.7 C) (Oral)   Ht 5\' 7"  (1.702 m)   Wt (!) 305 lb (138.3 kg)   SpO2 96%   BMI 47.77 kg/m  VS noted,  Constitutional: Pt is oriented to person, place, and time. Appears well-developed and well-nourished, in no significant distress and comfortable Head: Normocephalic and atraumatic  Eyes: Conjunctivae and EOM are normal. Pupils are equal, round, and reactive to light Right Ear: External ear normal without discharge Left Ear: External ear normal without discharge Nose: Nose without discharge or deformity Mouth/Throat: Oropharynx is without other ulcerations and moist  Neck: Normal range of motion. Neck supple. No JVD present. No tracheal deviation present or significant neck LA or mass Cardiovascular: Normal rate, regular rhythm, normal heart  sounds and intact distal pulses.   Pulmonary/Chest: WOB normal and breath sounds without rales or wheezing  Abdominal: Soft. Bowel sounds are normal. NT. No HSM  Musculoskeletal: Normal range of motion.  Exhibits no edema Lymphadenopathy: Has no other cervical adenopathy.  Neurological: Pt is alert and oriented to person, place, and time. Pt has normal reflexes. No cranial nerve deficit. Motor grossly intact, Gait intact Skin: Skin is warm and dry. No rash noted or new ulcerations Psychiatric:  Has normal mood and affect. Behavior is normal without agitation No other exam findings Lab Results  Component Value Date   WBC 8.3 09/12/2018   HGB 13.5 09/12/2018   HCT 42.3 09/12/2018   PLT 262.0 09/12/2018   GLUCOSE 90 09/12/2018   CHOL 190 09/12/2018   TRIG 211.0 (H) 09/12/2018   HDL 34.10 (L) 09/12/2018   LDLDIRECT 133.0 09/12/2018   LDLCALC 129 (H) 03/02/2017   ALT 39 09/12/2018   AST 21 09/12/2018   NA 138 09/12/2018   K 4.0 09/12/2018   CL 103 09/12/2018   CREATININE 1.19 09/12/2018   BUN 12 09/12/2018   CO2 26 09/12/2018   TSH 1.60 09/12/2018   HGBA1C 5.9 09/12/2018       Assessment & Plan:

## 2018-09-12 NOTE — Telephone Encounter (Signed)
Pt has viewed results via MyChart  

## 2018-10-01 ENCOUNTER — Encounter: Payer: Self-pay | Admitting: Internal Medicine

## 2018-10-01 MED ORDER — CYANOCOBALAMIN 1000 MCG/ML IJ SOLN: INTRAMUSCULAR | 3 refills | Status: DC

## 2018-10-01 MED FILL — VIT D2 1.25 MG (50,000 UNIT: 1.25 MG | 84 days supply | Qty: 12 | Fill #0

## 2018-10-02 MED FILL — CYANOCOBALAMIN 1,000 MCG/ML: 1000 | 90 days supply | Qty: 3 | Fill #0

## 2018-12-18 ENCOUNTER — Other Ambulatory Visit: Payer: Self-pay

## 2018-12-18 ENCOUNTER — Encounter: Payer: Self-pay | Admitting: Family Medicine

## 2018-12-18 ENCOUNTER — Ambulatory Visit: Payer: 59 | Admitting: Family Medicine

## 2018-12-18 VITALS — BP 112/70 | HR 105 | Temp 96.8°F | Ht 67.0 in | Wt 302.0 lb

## 2018-12-18 DIAGNOSIS — T7840XA Allergy, unspecified, initial encounter: Secondary | ICD-10-CM

## 2018-12-18 MED ORDER — EPINEPHRINE 0.3 MG/0.3ML IJ SOAJ: 0.3000 mg | INTRAMUSCULAR | 1 refills | Status: DC | PRN

## 2018-12-18 MED ORDER — PREDNISONE 20 MG PO TABS: 40.0000 mg | ORAL_TABLET | Freq: Every day | ORAL | 0 refills | Status: AC

## 2018-12-18 MED FILL — EPINEPHRINE 0.3 MG AUTO-INJ: 0.3 | 2 days supply | Qty: 2 | Fill #0

## 2018-12-18 MED FILL — predniSONE 20 MG TABS: 20 | 5 days supply | Qty: 10 | Fill #0

## 2018-12-18 NOTE — Progress Notes (Signed)
Chief Complaint  Patient presents with  . Oral Swelling    itching    John Porter is a 33 y.o. male here for an allergic reaction.  Duration: 3 days Any new medications, lotions, soaps, topicals or detergents? No ACEi/ARB/Estrogen? No Hx of allergic rxn/angioedema/anaphylaxis? No he  specifically denies shortness of breath, tongue or lip swelling, or swelling in the throat. Tried Benadryl with little relief.   ROS Allergic: As noted in HPI Pulmonary: No SOB MSK: No masses  Past Medical History:  Diagnosis Date  . Hyperlipidemia 06/01/2011    Family History  Problem Relation Age of Onset  . Hypertension Mother   . Hypertension Father   . Hyperlipidemia Father   . Asthma Father     BP 112/70 (BP Location: Left Arm, Patient Position: Sitting, Cuff Size: Large)   Pulse (!) 105   Temp (!) 96.8 F (36 C) (Temporal)   Ht 5\' 7"  (1.702 m)   Wt (!) 302 lb (137 kg)   SpO2 97%   BMI 47.30 kg/m  General: Well appearing, appearing stated age, well-nourished, awake HEENT: Ears are patent, TM's negative, Nose patent without discharge, MMM, tongue without deviation or edema, uvula without edema, pharynx without erythema or petechiae; Neck without masses, edema or asymmetry Heart: RRR, no murmurs Lungs: CTAB, no rales or stridor, normal respiratory effort without accessory muscle use Neuro: Alert and oriented, fluent and goal-oriented speech Skin: Exposed skin is warm and dry without lesion Psych: Age appropriate judgment and insight, normal affect and mood  Allergic reaction, initial encounter - Plan: predniSONE (DELTASONE) 20 MG tablet, EPINEPHrine 0.3 mg/0.3 mL IJ SOAJ injection  Orders as above. Pred burst, Zyrtec, Pepcid.  Offered allergy testing, allergy specialist referral, daily antihistamine. Pt informed to seek emergent care if starting to experience SOB, swelling with tongue or airway/neck.  F/u prn. The patient voiced understanding and agreement to the  plan.  [place], DO 12/18/18 3:31 PM

## 2018-12-18 NOTE — Patient Instructions (Signed)
Claritin (loratadine), Allegra (fexofenadine), Zyrtec (cetirizine) which is also equivalent to Xyzal (levocetirizine); these are listed in order from weakest to strongest. Generic, and therefore cheaper, options are in the parentheses.   Flonase (fluticasone); nasal spray that is over the counter. 2 sprays each nostril, once daily. Aim towards the same side eye when you spray.  There are available OTC, and the generic versions, which may be cheaper, are in parentheses. Show this to a pharmacist if you have trouble finding any of these items.  Pepcid (famotidine) 20 mg twice daily for 7 days.  If you get swelling in mouth, throat or shortness of breath, seek immediate care.  Let us know if you need anything.

## 2018-12-27 MED FILL — CYANOCOBALAMIN 1,000 MCG/ML: 1000 | 90 days supply | Qty: 3 | Fill #1

## 2019-02-25 ENCOUNTER — Encounter: Payer: Self-pay | Admitting: Family

## 2019-02-25 ENCOUNTER — Other Ambulatory Visit: Payer: Self-pay

## 2019-02-25 ENCOUNTER — Ambulatory Visit (INDEPENDENT_AMBULATORY_CARE_PROVIDER_SITE_OTHER): Payer: 59 | Admitting: Family

## 2019-02-25 DIAGNOSIS — J029 Acute pharyngitis, unspecified: Secondary | ICD-10-CM

## 2019-02-25 MED ORDER — LIDOCAINE VISCOUS HCL 2 % MT SOLN: 15.0000 mL | Freq: Four times a day (QID) | OROMUCOSAL | 0 refills | Status: DC | PRN

## 2019-02-25 MED ORDER — AMOXICILLIN 500 MG PO CAPS: 500.0000 mg | ORAL_CAPSULE | Freq: Three times a day (TID) | ORAL | 0 refills | Status: DC

## 2019-02-25 NOTE — Progress Notes (Signed)
Virtual Visit via Video Note  I connected with John Porter on 02/25/19 at  9:00 AM EST by a video enabled telemedicine application and verified that I am speaking with the correct person using two identifiers.  Location: Patient: home Provider: home   I discussed the limitations of evaluation and management by telemedicine and the availability of in person appointments. The patient expressed understanding and agreed to proceed.  History of Present Illness:  Patient is a 34 yr old male who presents today with chief complaint of sore throat. Reports pain is 8/10 with swallowing. Had a fever, chills, body aches.  Reports temp yesterday was 100.1 but no fever today. Denies HA, loss of taste or smell, cough, SOB, runny nose.  No known covid exposure.  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   Past Medical History:  Diagnosis Date  . Hyperlipidemia 06/01/2011     Social History   Socioeconomic History  . Marital status: Single    Spouse name: Not on file  . Number of children: Not on file  . Years of education: Not on file  . Highest education level: Not on file  Occupational History  . Not on file  Tobacco Use  . Smoking status: Never Smoker  . Smokeless tobacco: Never Used  Substance and Sexual Activity  . Alcohol use: No  . Drug use: No  . Sexual activity: Not on file  Other Topics Concern  . Not on file  Social History Narrative  . Not on file   Social Determinants of Health   Financial Resource Strain:   . Difficulty of Paying Living Expenses: Not on file  Food Insecurity:   . Worried About Programme researcher, broadcasting/film/video in the Last Year: Not on file  . Ran Out of Food in the Last Year: Not on file  Transportation Needs:   . Lack of Transportation (Medical): Not on file  . Lack of Transportation (Non-Medical): Not on file  Physical Activity:   . Days of Exercise per Week: Not on file  . Minutes of Exercise per Session: Not on file  Stress:   . Feeling of Stress : Not on  file  Social Connections:   . Frequency of Communication with Friends and Family: Not on file  . Frequency of Social Gatherings with Friends and Family: Not on file  . Attends Religious Services: Not on file  . Active Member of Clubs or Organizations: Not on file  . Attends Banker Meetings: Not on file  . Marital Status: Not on file  Intimate Partner Violence:   . Fear of Current or Ex-Partner: Not on file  . Emotionally Abused: Not on file  . Physically Abused: Not on file  . Sexually Abused: Not on file    No past surgical history on file.  Family History  Problem Relation Age of Onset  . Hypertension Mother   . Hypertension Father   . Hyperlipidemia Father   . Asthma Father     No Known Allergies  Current Outpatient Medications on File Prior to Visit  Medication Sig Dispense Refill  . cetirizine (ZYRTEC) 10 MG tablet Take 1 tablet (10 mg total) by mouth daily. 30 tablet 11  . cyanocobalamin (,VITAMIN B-12,) 1000 MCG/ML injection Take 1 ml IM monthly 3 mL 3  . EPINEPHrine 0.3 mg/0.3 mL IJ SOAJ injection Inject 0.3 mLs (0.3 mg total) into the muscle as needed for anaphylaxis. 1 each 1  . triamcinolone (NASACORT) 55 MCG/ACT AERO  nasal inhaler Place 2 sprays into the nose daily. 1 Inhaler 12  . Vitamin D, Ergocalciferol, (DRISDOL) 1.25 MG (50000 UT) CAPS capsule Take 1 capsule (50,000 Units total) by mouth every 7 (seven) days. 12 capsule 0   No current facility-administered medications on file prior to visit.    There were no vitals taken for this visit.     Observations/Objective:   Gen: Awake, alert, no acute distress ENT: poor visualization on camera of his tonsils but no obvious exudates Resp: Breathing is even and non-labored Psych: calm/pleasant demeanor Neuro: Alert and Oriented x 3, + facial symmetry, speech is clear.   Assessment and Plan:  Sore throat- pt will be treated empirically with amoxicillin for strep throat. He is scheduled for  covid-19 testing at [facility] at 12:20 today. I have advised him to keep this appointment and to quarantine pending review of his covid-19 results and further recommendations. He is advised to contact us with his results.  In the meantime, recommended that he quarantine pending result sof covid testing and may use viscous lidocaine and tylenol as needed for pain. He is instructed to go to the ER if symptoms worsen. Pt verbalizes understanding.    Follow Up Instructions:    I discussed the assessment and treatment plan with the patient. The patient was provided an opportunity to ask questions and all were answered. The patient agreed with the plan and demonstrated an understanding of the instructions.   The patient was advised to call back or seek an in-person evaluation if the symptoms worsen or if the condition fails to improve as anticipated.  Nance Pear, NP

## 2019-02-27 ENCOUNTER — Encounter: Payer: Self-pay | Admitting: Family

## 2019-03-16 ENCOUNTER — Telehealth: Payer: 59 | Admitting: Family

## 2019-03-16 DIAGNOSIS — R05 Cough: Secondary | ICD-10-CM

## 2019-03-16 DIAGNOSIS — R059 Cough, unspecified: Secondary | ICD-10-CM

## 2019-03-16 MED ORDER — PREDNISONE 10 MG (21) PO TBPK: ORAL_TABLET | ORAL | 0 refills | Status: DC

## 2019-03-16 MED ORDER — AZITHROMYCIN 250 MG PO TABS: ORAL_TABLET | ORAL | 0 refills | Status: DC

## 2019-03-16 MED ORDER — BENZONATATE 100 MG PO CAPS: 100.0000 mg | ORAL_CAPSULE | Freq: Three times a day (TID) | ORAL | 0 refills | Status: DC | PRN

## 2019-03-16 NOTE — Progress Notes (Signed)
We are sorry that you are not feeling well.  Here is how we plan to help!  Based on your presentation I believe you most likely have A cough due to bacteria.  When patients have a fever and a productive cough with a change in color or increased sputum production, we are concerned about bacterial bronchitis.  If left untreated it can progress to pneumonia.  If your symptoms do not improve with your treatment plan it is important that you contact your provider.   I have prescribed Azithromyin 250 mg: two tablets now and then one tablet daily for 4 additonal days    In addition you may use A non-prescription cough medication called Robitussin DAC. Take 2 teaspoons every 8 hours or Delsym: take 2 teaspoons every 12 hours., A non-prescription cough medication called Mucinex DM: take 2 tablets every 12 hours. and A prescription cough medication called Tessalon Perles 100mg . You may take 1-2 capsules every 8 hours as needed for your cough.  Prednisone 10 mg daily for 6 days (see taper instructions below)  Directions for 6 day taper: Day 1: 2 tablets before breakfast, 1 after both lunch & dinner and 2 at bedtime Day 2: 1 tab before breakfast, 1 after both lunch & dinner and 2 at bedtime Day 3: 1 tab at each meal & 1 at bedtime Day 4: 1 tab at breakfast, 1 at lunch, 1 at bedtime Day 5: 1 tab at breakfast & 1 tab at bedtime Day 6: 1 tab at breakfast  Given your symptoms, you also need to be tested for COVID to rule out.    From your responses in the eVisit questionnaire you describe inflammation in the upper respiratory tract which is causing a significant cough.  This is commonly called Bronchitis and has four common causes:    Allergies  Viral Infections  Acid Reflux  Bacterial Infection Allergies, viruses and acid reflux are treated by controlling symptoms or eliminating the cause. An example might be a cough caused by taking certain blood pressure medications. You stop the cough by changing the  medication. Another example might be a cough caused by acid reflux. Controlling the reflux helps control the cough.  USE OF BRONCHODILATOR ("RESCUE") INHALERS: There is a risk from using your bronchodilator too frequently.  The risk is that over-reliance on a medication which only relaxes the muscles surrounding the breathing tubes can reduce the effectiveness of medications prescribed to reduce swelling and congestion of the tubes themselves.  Although you feel brief relief from the bronchodilator inhaler, your asthma may actually be worsening with the tubes becoming more swollen and filled with mucus.  This can delay other crucial treatments, such as oral steroid medications. If you need to use a bronchodilator inhaler daily, several times per day, you should discuss this with your provider.  There are probably better treatments that could be used to keep your asthma under control.     HOME CARE . Only take medications as instructed by your medical team. . Complete the entire course of an antibiotic. . Drink plenty of fluids and get plenty of rest. . Avoid close contacts especially the very young and the elderly . Cover your mouth if you cough or cough into your sleeve. . Always remember to wash your hands . A steam or ultrasonic humidifier can help congestion.   GET HELP RIGHT AWAY IF: . You develop worsening fever. . You become short of breath . You cough up blood. . Your symptoms persist  after you have completed your treatment plan MAKE SURE YOU   Understand these instructions.  Will watch your condition.  Will get help right away if you are not doing well or get worse.  Your e-visit answers were reviewed by a board certified advanced clinical practitioner to complete your personal care plan.  Depending on the condition, your plan could have included both over the counter or prescription medications. If there is a problem please reply  once you have received a response from your  provider. Your safety is important to [place].  If you have drug allergies check your prescription carefully.    You can use MyChart to ask questions about today's visit, request a non-urgent call back, or ask for a work or school excuse for 24 hours related to this e-Visit. If it has been greater than 24 hours you will need to follow up with your provider, or enter a new e-Visit to address those concerns. You will get an e-mail in the next two days asking about your experience.  I hope that your e-visit has been valuable and will speed your recovery. Thank you for using e-visits.   Approximately 5 minutes was spent documenting and reviewing patient's chart.

## 2019-03-19 ENCOUNTER — Ambulatory Visit (INDEPENDENT_AMBULATORY_CARE_PROVIDER_SITE_OTHER): Payer: 59 | Admitting: Family Medicine

## 2019-03-19 ENCOUNTER — Ambulatory Visit (INDEPENDENT_AMBULATORY_CARE_PROVIDER_SITE_OTHER): Payer: 59 | Admitting: Family

## 2019-03-19 ENCOUNTER — Encounter: Payer: Self-pay | Admitting: Family

## 2019-03-19 ENCOUNTER — Other Ambulatory Visit: Payer: Self-pay

## 2019-03-19 ENCOUNTER — Ambulatory Visit (HOSPITAL_COMMUNITY)
Admission: RE | Admit: 2019-03-19 | Discharge: 2019-03-19 | Disposition: A | Discharge status: Home / Self Care | Payer: 59 | Source: Ambulatory Visit | Attending: Family | Admitting: Family

## 2019-03-19 VITALS — BP 142/68 | HR 115 | Temp 99.4°F | Ht 68.0 in | Wt 289.0 lb

## 2019-03-19 DIAGNOSIS — R000 Tachycardia, unspecified: Secondary | ICD-10-CM | POA: Diagnosis not present

## 2019-03-19 DIAGNOSIS — J9811 Atelectasis: Secondary | ICD-10-CM | POA: Diagnosis not present

## 2019-03-19 DIAGNOSIS — R053 Chronic cough: Secondary | ICD-10-CM

## 2019-03-19 DIAGNOSIS — R05 Cough: Secondary | ICD-10-CM

## 2019-03-19 DIAGNOSIS — R0602 Shortness of breath: Secondary | ICD-10-CM

## 2019-03-19 DIAGNOSIS — R509 Fever, unspecified: Secondary | ICD-10-CM | POA: Diagnosis not present

## 2019-03-19 DIAGNOSIS — Z1152 Encounter for screening for COVID-19: Secondary | ICD-10-CM | POA: Diagnosis not present

## 2019-03-19 DIAGNOSIS — R059 Cough, unspecified: Secondary | ICD-10-CM

## 2019-03-19 MED ORDER — AMOXICILLIN-POT CLAVULANATE 875-125 MG PO TABS: 1.0000 | ORAL_TABLET | Freq: Two times a day (BID) | ORAL | 0 refills | Status: DC

## 2019-03-19 MED ORDER — ALBUTEROL SULFATE HFA 108 (90 BASE) MCG/ACT IN AERS: 2.0000 | INHALATION_SPRAY | RESPIRATORY_TRACT | Status: DC | PRN

## 2019-03-19 MED ORDER — HYDROCODONE-HOMATROPINE 5-1.5 MG/5ML PO SYRP: 5.0000 mL | ORAL_SOLUTION | Freq: Two times a day (BID) | ORAL | 0 refills | Status: DC | PRN

## 2019-03-19 NOTE — Patient Instructions (Addendum)
Please purchase a pulse oximeter and if oxygen drops to 90% and sustains at that level of less go immediately to the emergency .  Perform deep breathing exercises a 10 cycles of 10 deep breaths while awake to improve lung volume.   Your COVID 19 results will be available in 48-72 hours. Negative results are immediately resulted to Mychart. All positive results are communicated with a phone call from our office.  Go to nearest [facility] to have lab work completed.  Repeat chest x-ray next week. Follow-up 03/25/19. 8:00-4:30 For x-ray imaging: Go [facility]  [address] [place], [place], [place] [address] Enter through Main Entrance and request x-ray department. You will be contacted either via phone or via Mychart with your x-ray result.

## 2019-03-19 NOTE — Progress Notes (Signed)
Virtual Visit via Video Note  I connected with John Porter on 03/19/19 at  3:20 PM EST by a video enabled telemedicine application and verified that I am speaking with the correct person using two identifiers.  Location: Patient: home Provider: work   I discussed the limitations of evaluation and management by telemedicine and the availability of in person appointments. The patient expressed understanding and agreed to proceed.  History of Present Illness:  Patient is a 34 yr old male who presents today with chief complaint of dry cough. He did an e-visit on 03/16/19 due to cough. He was given an rx for azithromycin and tessalon perles. He was also treated with a 6 day steroid taper and referred for covid testing. He notes very little improvement in his symptoms since that time.   We last saw him on 02/25/19 with c/o sore throat. He was treated empirically for strep at that time.   Reports that symptoms started 2-3 weeks ago- cough, cold chills.  He does have some mild SOB. Temp 99.4.  Took Tylenol at 3 AM.  He has not been tested for covid. Denies chest pain. Denies sore throat. Denies loss of taste or smell. Notes 4 of his co-workers whom he has had close contact with are out of work due to COVID-19 infection.    Past Medical History:  Diagnosis Date  . Hyperlipidemia 06/01/2011     Social History   Socioeconomic History  . Marital status: Single    Spouse name: Not on file  . Number of children: Not on file  . Years of education: Not on file  . Highest education level: Not on file  Occupational History  . Not on file  Tobacco Use  . Smoking status: Never Smoker  . Smokeless tobacco: Never Used  Substance and Sexual Activity  . Alcohol use: No  . Drug use: No  . Sexual activity: Not on file  Other Topics Concern  . Not on file  Social History Narrative  . Not on file   Social Determinants of Health   Financial Resource Strain:   . Difficulty of Paying Living Expenses:  Not on file  Food Insecurity:   . Worried About Programme researcher, broadcasting/film/video in the Last Year: Not on file  . Ran Out of Food in the Last Year: Not on file  Transportation Needs:   . Lack of Transportation (Medical): Not on file  . Lack of Transportation (Non-Medical): Not on file  Physical Activity:   . Days of Exercise per Week: Not on file  . Minutes of Exercise per Session: Not on file  Stress:   . Feeling of Stress : Not on file  Social Connections:   . Frequency of Communication with Friends and Family: Not on file  . Frequency of Social Gatherings with Friends and Family: Not on file  . Attends Religious Services: Not on file  . Active Member of Clubs or Organizations: Not on file  . Attends Banker Meetings: Not on file  . Marital Status: Not on file  Intimate Partner Violence:   . Fear of Current or Ex-Partner: Not on file  . Emotionally Abused: Not on file  . Physically Abused: Not on file  . Sexually Abused: Not on file    No past surgical history on file.  Family History  Problem Relation Age of Onset  . Hypertension Mother   . Hypertension Father   . Hyperlipidemia Father   . Asthma Father  No Known Allergies  Current Outpatient Medications on File Prior to Visit  Medication Sig Dispense Refill  . azithromycin (ZITHROMAX) 250 MG tablet Take 500 mg once, then 250 mg for four days 6 tablet 0  . cetirizine (ZYRTEC) 10 MG tablet Take 1 tablet (10 mg total) by mouth daily. 30 tablet 11  . cyanocobalamin (,VITAMIN B-12,) 1000 MCG/ML injection Take 1 ml IM monthly 3 mL 3  . EPINEPHrine 0.3 mg/0.3 mL IJ SOAJ injection Inject 0.3 mLs (0.3 mg total) into the muscle as needed for anaphylaxis. 1 each 1  . lidocaine (XYLOCAINE) 2 % solution Use as directed 15 mLs in the mouth or throat every 6 (six) hours as needed for mouth pain. 200 mL 0  . predniSONE (STERAPRED UNI-PAK 21 TAB) 10 MG (21) TBPK tablet Use as directed 21 tablet 0  . triamcinolone (NASACORT) 55  MCG/ACT AERO nasal inhaler Place 2 sprays into the nose daily. 1 Inhaler 12  . Vitamin D, Ergocalciferol, (DRISDOL) 1.25 MG (50000 UT) CAPS capsule Take 1 capsule (50,000 Units total) by mouth every 7 (seven) days. 12 capsule 0   No current facility-administered medications on file prior to visit.    There were no vitals taken for this visit.   Observations/Objective:   Gen: Awake, alert, no acute distress Resp: Breathing is even and non-labored Psych: calm/pleasant demeanor Neuro: Alert and Oriented x 3, + facial symmetry, speech is clear.   Assessment and Plan:  Cough- given duration and lack of clinical improvement, I have advised pt that he should be evaluated in our respiratory clinic for a face to face visit with covid testing this evening. Will arrange.   15 minutes spent on today's visit.   Follow Up Instructions:    I discussed the assessment and treatment plan with the patient. The patient was provided an opportunity to ask questions and all were answered. The patient agreed with the plan and demonstrated an understanding of the instructions.   The patient was advised to call back or seek an in-person evaluation if the symptoms worsen or if the condition fails to improve as anticipated.  Nance Pear, NP

## 2019-03-19 NOTE — Progress Notes (Signed)
Patient ID: John Porter, male    DOB: 1986/02/05, 34 y.o.   MRN: [id number]  PCP: Biagio Borg, MD  Chief Complaint  Patient presents with  . Cough    ongoing for one week,mylagia, SOB     Subjective:  HPI John Porter is a 34 y.o. male presents to [facility] clinic for evaluation symptoms of worsening shortness of breath, palpitations, body aches, and persistent cough.  Patient reports he has been ill with various symptoms for the entire month of January. Previously tested negative for COVID-19. Treated about 3 weeks for strep pharyngitis infection which resolved throat pain, however, symptoms present today have gradually worsened. He has sustained 10-12 lbs weight loss since onset of symptoms. He is hypertensive on arrival today, although has not documented history of hypertension. He has not history of asthma or bronchitis. He is morbidly obese Body mass index is 43.94 kg/m. No history of cardiovascular disease. He is not experiencing BLE edema. Managing symptoms at home with delsym and benzonatate which is not effective in management of cough.  He is non smoker and has no direct exposure to chemicals or respiratory irritants.   Review of Systems Pertinent negatives listed in HPI  Patient Active Problem List   Diagnosis Date Noted  . Allergic rhinitis 06/04/2018  . Flu-like symptoms 12/29/2016  . Hyperglycemia 03/11/2016  . Possible exposure to STD 01/14/2014  . Hyperlipidemia 06/01/2011  . Genital herpes 05/17/2011  . Preventative health care 05/12/2011      Prior to Admission medications   Medication Sig Start Date End Date Taking? Authorizing Provider  azithromycin (ZITHROMAX) 250 MG tablet Take 500 mg once, then 250 mg for four days 03/16/19  Yes Hawks, Alyse Low A, FNP  cyanocobalamin (,VITAMIN B-12,) 1000 MCG/ML injection Take 1 ml IM monthly 10/01/18  Yes Biagio Borg, MD  EPINEPHrine 0.3 mg/0.3 mL IJ SOAJ injection Inject 0.3 mLs (0.3 mg total) into the muscle as  needed for anaphylaxis. 12/18/18  Yes Shelda Pal, DO  predniSONE (STERAPRED UNI-PAK 21 TAB) 10 MG (21) TBPK tablet Use as directed 03/16/19  Yes Hawks, Christy A, FNP  triamcinolone (NASACORT) 55 MCG/ACT AERO nasal inhaler Place 2 sprays into the nose daily. 06/04/18  Yes Biagio Borg, MD  Vitamin D, Ergocalciferol, (DRISDOL) 1.25 MG (50000 UT) CAPS capsule Take 1 capsule (50,000 Units total) by mouth every 7 (seven) days. 09/12/18  Yes Biagio Borg, MD    Past Medical, Surgical Family and Social History reviewed and updated.    Objective:   Today's Vitals   03/19/19 1743  BP: (!) 142/68  Pulse: (!) 115  Temp: 99.4 F (37.4 C)  SpO2: 95%  Weight: 289 lb (131.1 kg)  Height: 5\' 8"  (1.727 m)    Wt Readings from Last 3 Encounters:  03/19/19 289 lb (131.1 kg)  12/18/18 (!) 302 lb (137 kg)  09/12/18 (!) 305 lb (138.3 kg)    Physical Exam Constitutional:      Appearance: He is ill-appearing.  HENT:     Nose: No congestion or rhinorrhea.  Cardiovascular:     Rate and Rhythm: Tachycardia present.  Pulmonary:     Effort: Prolonged expiration present.     Breath sounds: Decreased air movement present. Examination of the right-upper field reveals rhonchi. Examination of the left-upper field reveals rhonchi. Decreased breath sounds and rhonchi present.  Abdominal:     General: There is distension.     Comments: Soft abdomen   Musculoskeletal:  General: Normal range of motion.  Skin:    General: Skin is warm and dry.  Neurological:     Mental Status: He is oriented to person, place, and time.  Psychiatric:        Mood and Affect: Mood normal.        Behavior: Behavior is cooperative.     Assessment & Plan:  1. Fever, unspecified fever cause, low grade today.  -Patient is taking tylenol daily for body aches which could be masking fever. -Encouraged to hydrate given accelerated heart rate is likely related to dehydration and prolonged fever. - Novel  Coronavirus, NAA ([facility]); Future, pending -CBC with Diff pending    2. Encounter for screening for COVID-19 -Covid -19 test pending   3. Atelectasis, bibasilar noted on recent chest x-ray -Encouraged several cycles of deep controlled breathing to improve volumes and improve inflation of lungs  -Patient oxygen level  Fluctuated between 92-93% at rest.  -Repeat DG Chest 2 View, in 1 week prior to follow-up visit.  4. Shortness of breath - albuterol (VENTOLIN HFA) 108 (90 Base) MCG/ACT inhaler 2 puff - Comprehensive metabolic panel (evaluate electrolyte status and renal function) -Recommend pulse ox to monitor oxygen level at home give low normal readings here in office today. - DG Chest 2 View; Future, in 1 week prior to follow-up visit.  5. Tachycardia - EKG 12-Lead, Sinus Tachycardia no ischemic changes or ST changes noted on ECG. -Likely related to current illness and dehydration. Encouraged to force oral intake of fluids.  -If worsens or if chest pain occurs with palpitations, go immediately to the ER   6. Persistent Cough, exam findings concerning for possible bronchitis  -Covid test pending -Treating for cough related to bronchitis, Start Augment BID X 10 days -Albuterol inhaler with Chamber 2 puffs every 4-6 hours as needed for cough, wheezing, and SOB -Hycodan prescribed for cough. (sedative effects of medication provided to patient and warning to avoid use while operating machinery or driving).  Meds ordered this encounter  Medications  . albuterol (VENTOLIN HFA) 108 (90 Base) MCG/ACT inhaler 2 puff  . amoxicillin-clavulanate (AUGMENTIN) 875-125 MG tablet    Sig: Take 1 tablet by mouth 2 (two) times daily.    Dispense:  20 tablet    Refill:  0  . HYDROcodone-homatropine (HYCODAN) 5-1.5 MG/5ML syrup    Sig: Take 5 mLs by mouth every 12 (twelve) hours as needed for cough (avoid driving while taking medication).    Dispense:  100 mL    Refill:  0     -The patient was  given clear instructions to go to ER or return to medical center if symptoms do not improve, worsen or new problems develop. The patient verbalized understanding.     Joaquin Courts, FNP-C [facility], PRN Provider  [facility]. [place], [place]  Clinic Phone: [phone] Clinic Fax: [phone] Clinic Hours: 5:30 pm -7:30 pm (Monday-Friday)

## 2019-03-19 NOTE — Addendum Note (Signed)
Addended by: Wilford Corner on: 03/19/2019 02:28 PM   Modules accepted: Orders

## 2019-03-20 ENCOUNTER — Ambulatory Visit: Payer: 59

## 2019-03-21 ENCOUNTER — Emergency Department (HOSPITAL_BASED_OUTPATIENT_CLINIC_OR_DEPARTMENT_OTHER): Payer: 59

## 2019-03-21 ENCOUNTER — Other Ambulatory Visit: Payer: Self-pay

## 2019-03-21 ENCOUNTER — Encounter (HOSPITAL_BASED_OUTPATIENT_CLINIC_OR_DEPARTMENT_OTHER): Payer: Self-pay | Admitting: Emergency Medicine

## 2019-03-21 ENCOUNTER — Inpatient Hospital Stay (HOSPITAL_BASED_OUTPATIENT_CLINIC_OR_DEPARTMENT_OTHER)
Admission: EM | Admit: 2019-03-21 | Discharge: 2019-03-23 | DRG: 177 | Disposition: A | Discharge status: Home / Self Care | Payer: 59 | Attending: Family Medicine | Admitting: Family Medicine

## 2019-03-21 DIAGNOSIS — N170 Acute kidney failure with tubular necrosis: Secondary | ICD-10-CM | POA: Diagnosis present

## 2019-03-21 DIAGNOSIS — R739 Hyperglycemia, unspecified: Secondary | ICD-10-CM | POA: Diagnosis present

## 2019-03-21 DIAGNOSIS — T380X5A Adverse effect of glucocorticoids and synthetic analogues, initial encounter: Secondary | ICD-10-CM | POA: Diagnosis present

## 2019-03-21 DIAGNOSIS — J9601 Acute respiratory failure with hypoxia: Secondary | ICD-10-CM | POA: Diagnosis present

## 2019-03-21 DIAGNOSIS — J80 Acute respiratory distress syndrome: Secondary | ICD-10-CM

## 2019-03-21 DIAGNOSIS — E785 Hyperlipidemia, unspecified: Secondary | ICD-10-CM | POA: Diagnosis present

## 2019-03-21 DIAGNOSIS — R7303 Prediabetes: Secondary | ICD-10-CM

## 2019-03-21 DIAGNOSIS — Z825 Family history of asthma and other chronic lower respiratory diseases: Secondary | ICD-10-CM

## 2019-03-21 DIAGNOSIS — Z8349 Family history of other endocrine, nutritional and metabolic diseases: Secondary | ICD-10-CM

## 2019-03-21 DIAGNOSIS — U071 COVID-19: Secondary | ICD-10-CM | POA: Diagnosis present

## 2019-03-21 DIAGNOSIS — E860 Dehydration: Secondary | ICD-10-CM | POA: Diagnosis present

## 2019-03-21 DIAGNOSIS — Z6841 Body Mass Index (BMI) 40.0 and over, adult: Secondary | ICD-10-CM | POA: Diagnosis not present

## 2019-03-21 DIAGNOSIS — J1282 Pneumonia due to coronavirus disease 2019: Secondary | ICD-10-CM | POA: Diagnosis present

## 2019-03-21 DIAGNOSIS — Z8249 Family history of ischemic heart disease and other diseases of the circulatory system: Secondary | ICD-10-CM

## 2019-03-21 DIAGNOSIS — E66813 Obesity, class 3: Secondary | ICD-10-CM | POA: Diagnosis present

## 2019-03-21 DIAGNOSIS — R0602 Shortness of breath: Secondary | ICD-10-CM | POA: Diagnosis present

## 2019-03-21 HISTORY — DX: Obesity, unspecified: E66.9

## 2019-03-21 LAB — CBC WITH DIFFERENTIAL/PLATELET
Abs Immature Granulocytes: 0.15 10*3/uL — ABNORMAL HIGH (ref 0.00–0.07)
Basophils Absolute: 0 10*3/uL (ref 0.0–0.1)
Basophils Relative: 0 %
Eosinophils Absolute: 0 10*3/uL (ref 0.0–0.5)
Eosinophils Relative: 0 %
HCT: 43.3 % (ref 39.0–52.0)
Hemoglobin: 13.8 g/dL (ref 13.0–17.0)
Immature Granulocytes: 1 %
Lymphocytes Relative: 13 %
Lymphs Abs: 2.2 10*3/uL (ref 0.7–4.0)
MCH: 23.4 pg — ABNORMAL LOW (ref 26.0–34.0)
MCHC: 31.9 g/dL (ref 30.0–36.0)
MCV: 73.4 fL — ABNORMAL LOW (ref 80.0–100.0)
Monocytes Absolute: 0.4 10*3/uL (ref 0.1–1.0)
Monocytes Relative: 2 %
Neutro Abs: 14.8 10*3/uL — ABNORMAL HIGH (ref 1.7–7.7)
Neutrophils Relative %: 84 %
Platelets: 205 10*3/uL (ref 150–400)
RBC: 5.9 MIL/uL — ABNORMAL HIGH (ref 4.22–5.81)
RDW: 14.6 % (ref 11.5–15.5)
WBC: 17.6 10*3/uL — ABNORMAL HIGH (ref 4.0–10.5)
nRBC: 0 % (ref 0.0–0.2)

## 2019-03-21 LAB — LACTIC ACID, PLASMA: Lactic Acid, Venous: 1.3 mmol/L (ref 0.5–1.9)

## 2019-03-21 LAB — COMPREHENSIVE METABOLIC PANEL
ALT: 25 U/L (ref 0–44)
AST: 36 U/L (ref 15–41)
Albumin: 3.6 g/dL (ref 3.5–5.0)
Alkaline Phosphatase: 36 U/L — ABNORMAL LOW (ref 38–126)
Anion gap: 13 (ref 5–15)
BUN: 15 mg/dL (ref 6–20)
CO2: 23 mmol/L (ref 22–32)
Calcium: 8.9 mg/dL (ref 8.9–10.3)
Chloride: 97 mmol/L — ABNORMAL LOW (ref 98–111)
Creatinine, Ser: 1.39 mg/dL — ABNORMAL HIGH (ref 0.61–1.24)
GFR calc Af Amer: >60 mL/min (ref >60)
GFR calc non Af Amer: >60 mL/min (ref >60)
Glucose, Bld: 123 mg/dL — ABNORMAL HIGH (ref 70–99)
Potassium: 3.8 mmol/L (ref 3.5–5.1)
Sodium: 133 mmol/L — ABNORMAL LOW (ref 135–145)
Total Bilirubin: 1 mg/dL (ref 0.3–1.2)
Total Protein: 8.4 g/dL — ABNORMAL HIGH (ref 6.5–8.1)

## 2019-03-21 LAB — URINALYSIS, ROUTINE W REFLEX MICROSCOPIC
Glucose, UA: NEGATIVE mg/dL
Ketones, ur: 15 mg/dL — AB
Leukocytes,Ua: NEGATIVE
Nitrite: NEGATIVE
Protein, ur: >300 mg/dL — AB
Specific Gravity, Urine: >1.03 — ABNORMAL HIGH (ref 1.005–1.030)
pH: 6 (ref 5.0–8.0)

## 2019-03-21 LAB — PROTIME-INR
INR: 1.2 (ref 0.8–1.2)
Prothrombin Time: 15.4 seconds — ABNORMAL HIGH (ref 11.4–15.2)

## 2019-03-21 LAB — D-DIMER, QUANTITATIVE: D-Dimer, Quant: 0.89 ug/mL-FEU — ABNORMAL HIGH (ref 0.00–0.50)

## 2019-03-21 LAB — URINALYSIS, MICROSCOPIC (REFLEX)

## 2019-03-21 LAB — FERRITIN: Ferritin: 859 ng/mL — ABNORMAL HIGH (ref 24–336)

## 2019-03-21 LAB — SARS CORONAVIRUS 2 BY RT PCR (HOSPITAL ORDER, PERFORMED IN ~~LOC~~ HOSPITAL LAB): SARS Coronavirus 2: POSITIVE — AB

## 2019-03-21 LAB — TRIGLYCERIDES: Triglycerides: 137 mg/dL (ref <150)

## 2019-03-21 LAB — C-REACTIVE PROTEIN: CRP: 19.4 mg/dL — ABNORMAL HIGH (ref <1.0)

## 2019-03-21 LAB — CBG MONITORING, ED: Glucose-Capillary: 121 mg/dL — ABNORMAL HIGH (ref 70–99)

## 2019-03-21 LAB — RESPIRATORY PANEL BY RT PCR (FLU A&B, COVID)
Influenza A by PCR: NEGATIVE
Influenza B by PCR: NEGATIVE
SARS Coronavirus 2 by RT PCR: POSITIVE — AB

## 2019-03-21 LAB — NOVEL CORONAVIRUS, NAA: SARS-CoV-2, NAA: NOT DETECTED

## 2019-03-21 LAB — FIBRINOGEN: Fibrinogen: 775 mg/dL — ABNORMAL HIGH (ref 210–475)

## 2019-03-21 LAB — APTT: aPTT: 38 seconds — ABNORMAL HIGH (ref 24–36)

## 2019-03-21 LAB — PROCALCITONIN: Procalcitonin: 0.2 ng/mL

## 2019-03-21 LAB — LACTATE DEHYDROGENASE: LDH: 224 U/L — ABNORMAL HIGH (ref 98–192)

## 2019-03-21 MED ORDER — SODIUM CHLORIDE 0.9 % IV SOLN
2.0000 g | INTRAVENOUS | Status: DC
  Administered 2019-03-21 – 2019-03-22 (×2): 2 g via INTRAVENOUS
  Filled 2019-03-21 (×2): qty 20

## 2019-03-21 MED ORDER — ASCORBIC ACID 500 MG PO TABS
500.0000 mg | ORAL_TABLET | Freq: Every day | ORAL | Status: DC
  Administered 2019-03-21 – 2019-03-23 (×3): 500 mg via ORAL
  Filled 2019-03-21 (×3): qty 1

## 2019-03-21 MED ORDER — ONDANSETRON HCL 4 MG/2ML IJ SOLN: 4.0000 mg | Freq: Four times a day (QID) | INTRAMUSCULAR | Status: DC | PRN

## 2019-03-21 MED ORDER — SODIUM CHLORIDE 0.9 % IV BOLUS (SEPSIS)
1000.0000 mL | Freq: Once | INTRAVENOUS | Status: AC
  Administered 2019-03-21: 1000 mL via INTRAVENOUS

## 2019-03-21 MED ORDER — ZINC SULFATE 220 (50 ZN) MG PO CAPS
220.0000 mg | ORAL_CAPSULE | Freq: Every day | ORAL | Status: DC
  Administered 2019-03-21 – 2019-03-23 (×3): 220 mg via ORAL
  Filled 2019-03-21 (×3): qty 1

## 2019-03-21 MED ORDER — IPRATROPIUM-ALBUTEROL 20-100 MCG/ACT IN AERS
1.0000 | INHALATION_SPRAY | Freq: Four times a day (QID) | RESPIRATORY_TRACT | Status: DC
  Administered 2019-03-21 – 2019-03-23 (×5): 1 via RESPIRATORY_TRACT
  Filled 2019-03-21: qty 4

## 2019-03-21 MED ORDER — SODIUM CHLORIDE 0.9 % IV SOLN
200.0000 mg | Freq: Once | INTRAVENOUS | Status: AC
  Administered 2019-03-21: 200 mg via INTRAVENOUS
  Filled 2019-03-21: qty 40

## 2019-03-21 MED ORDER — GUAIFENESIN-DM 100-10 MG/5ML PO SYRP: 10.0000 mL | ORAL_SOLUTION | ORAL | Status: DC | PRN

## 2019-03-21 MED ORDER — ACETAMINOPHEN 325 MG PO TABS: 650.0000 mg | ORAL_TABLET | Freq: Four times a day (QID) | ORAL | Status: DC | PRN

## 2019-03-21 MED ORDER — METHYLPREDNISOLONE SODIUM SUCC 125 MG IJ SOLR
60.0000 mg | Freq: Two times a day (BID) | INTRAMUSCULAR | Status: DC
  Administered 2019-03-21 – 2019-03-22 (×2): 60 mg via INTRAVENOUS
  Filled 2019-03-21 (×2): qty 2

## 2019-03-21 MED ORDER — SODIUM CHLORIDE 0.9 % IV SOLN
100.0000 mg | Freq: Every day | INTRAVENOUS | Status: DC
  Administered 2019-03-22 – 2019-03-23 (×2): 100 mg via INTRAVENOUS
  Filled 2019-03-21 (×2): qty 20

## 2019-03-21 MED ORDER — SODIUM CHLORIDE 0.9 % IV SOLN
500.0000 mg | INTRAVENOUS | Status: DC
  Administered 2019-03-21 – 2019-03-22 (×2): 500 mg via INTRAVENOUS
  Filled 2019-03-21 (×2): qty 500

## 2019-03-21 MED ORDER — HYDROCOD POLST-CPM POLST ER 10-8 MG/5ML PO SUER: 5.0000 mL | Freq: Two times a day (BID) | ORAL | Status: DC | PRN

## 2019-03-21 MED ORDER — DOCUSATE SODIUM 100 MG PO CAPS
100.0000 mg | ORAL_CAPSULE | Freq: Every day | ORAL | Status: DC
  Filled 2019-03-21 (×2): qty 1

## 2019-03-21 MED ORDER — ENOXAPARIN SODIUM 80 MG/0.8ML ~~LOC~~ SOLN
65.0000 mg | SUBCUTANEOUS | Status: DC
  Administered 2019-03-21 – 2019-03-22 (×2): 65 mg via SUBCUTANEOUS
  Filled 2019-03-21 (×2): qty 0.8

## 2019-03-21 MED ORDER — IOHEXOL 350 MG/ML SOLN
100.0000 mL | Freq: Once | INTRAVENOUS | Status: AC | PRN
  Administered 2019-03-21: 100 mL via INTRAVENOUS

## 2019-03-21 MED ORDER — ONDANSETRON HCL 4 MG PO TABS: 4.0000 mg | ORAL_TABLET | Freq: Four times a day (QID) | ORAL | Status: DC | PRN

## 2019-03-21 NOTE — H&P (Signed)
TRH H&P   Patient Demographics:    John Porter, is a 34 y.o. male  MRN: [id number]   DOB - 06-23-1985  Admit Date - 03/21/2019  Outpatient Primary MD for the patient is Biagio Borg, MD  Patient coming from: [facility]  Chief Complaint  Patient presents with  . Shortness of Breath      HPI:    John Porter  is a 34 y.o. male, with HLD, obesity, prediabetes presents as a transfer from [facility] for management of acute respiratory failure with hypoxia secondary to SARS-CoV-2.  Patient was doing well until about a week ago when he developed URI symptoms including cough productive of clear sputum.  He denies any sick contacts.    Review of systems:  Review of Systems:  Constitutional: negative for anorexia, chills, fatigue or fevers HEENT: negative for earaches, epistaxis, or sore throat Respiratory: see HPI Cardiovascular: negative for chest pain, palpitations, or syncope GU: negative for dysuria, urinary frequency, urinary urgency, hematuria Gastrointestinal: negative for abdominal pain, constipation, diarrhea, nausea or vomiting Musculoskeletal: negative for arthralgias, back pain or myalgias Neurological: negative for dizziness, headaches or weakness Behavioral/Psych: negative for suicidal or homicidal ideation Skin:negative for rash Heme: negative for bruises Endo: negative for hair loss, weight gain/loss  With Past History of the following :   Past Medical History:  Diagnosis Date  . Hyperlipidemia 06/01/2011  . Obesity     History reviewed. No pertinent surgical history.   Social History:   Social History   Tobacco Use  . Smoking status: Never Smoker  . Smokeless tobacco: Never Used  Substance Use Topics   . Alcohol use: No     Family History :    Family History  Problem Relation Age of Onset  . Hypertension Mother   . Hypertension Father   . Hyperlipidemia Father   . Asthma Father     Home Medications:   Prior to Admission medications   Medication Sig Start Date End Date Taking? Authorizing Provider  amoxicillin-clavulanate (AUGMENTIN) 875-125 MG tablet Take 1 tablet by mouth 2 (two) times daily. 03/19/19  Yes Scot Jun, FNP  azithromycin (ZITHROMAX) 250 MG tablet Take 500 mg once, then 250 mg for four days 03/16/19  Yes Hawks, Christy A, FNP  HYDROcodone-homatropine (HYCODAN) 5-1.5 MG/5ML syrup Take 5 mLs by mouth every 12 (  twelve) hours as needed for cough (avoid driving while taking medication). 03/19/19  Yes Bing Neighbors, FNP  predniSONE (STERAPRED UNI-PAK 21 TAB) 10 MG (21) TBPK tablet Use as directed 03/16/19  Yes Hawks, Christy A, FNP  cyanocobalamin (,VITAMIN B-12,) 1000 MCG/ML injection Take 1 ml IM monthly 10/01/18   Corwin Levins, MD  EPINEPHrine 0.3 mg/0.3 mL IJ SOAJ injection Inject 0.3 mLs (0.3 mg total) into the muscle as needed for anaphylaxis. 12/18/18   Sharlene Dory, DO  Vitamin D, Ergocalciferol, (DRISDOL) 1.25 MG (50000 UT) CAPS capsule Take 1 capsule (50,000 Units total) by mouth every 7 (seven) days. 09/12/18   Corwin Levins, MD    Allergies:   No Known Allergies   Physical Exam:   Vitals  Blood pressure (!) 147/95, pulse (!) 111, temperature 99.8 F (37.7 C), temperature source Oral, resp. rate (!) 35, height 5\' 8"  (1.727 m), weight 129.8 kg, SpO2 97 %.  Physical Exam   Constitutional - resting comfortably, no acute distress Eyes - pupils equal round and reactive to light and accomodation, extra ocular movements intact Nose - no gross deformity or drainage Mouth - no oral lesions noted Throat - no swelling or erythema Neck - supple, no JVD   CV - (+)S1S2, no murmurs  Resp - CTA bilaterally, no wheezing or crackles,  GI - (+)BS, soft,  non-tender, non-distended Extrem - no clubbing, cyanosis, or peripheral edema  Skin - no rashes or wounds Neuro - alert, aware, oriented to person/place/time  Psych - normal affect, no anxiety   Patient has Pressure Ulcer on Admission?: no   Data Review:    CBC Recent Labs  Lab 03/21/19 1038  WBC 17.6*  HGB 13.8  HCT 43.3  PLT 205  MCV 73.4*  MCH 23.4*  MCHC 31.9  RDW 14.6  LYMPHSABS 2.2  MONOABS 0.4  EOSABS 0.0  BASOSABS 0.0   ------------------------------------------------------------------------------------------------------------------  Chemistries  Recent Labs  Lab 03/21/19 1038  NA 133*  K 3.8  CL 97*  CO2 23  GLUCOSE 123*  BUN 15  CREATININE 1.39*  CALCIUM 8.9  AST 36  ALT 25  ALKPHOS 36*  BILITOT 1.0   ------------------------------------------------------------------------------------------------------------------ estimated creatinine clearance is 98.5 mL/min (A) (by C-G formula based on SCr of 1.39 mg/dL (H)). ------------------------------------------------------------------------------------------------------------------ No results for input(s): TSH, T4TOTAL, T3FREE, THYROIDAB in the last 72 hours.  Invalid input(s): FREET3  Coagulation profile Recent Labs  Lab 03/21/19 1038  INR 1.2   ------------------------------------------------------------------------------------------------------------------- Recent Labs    03/21/19 1315  DDIMER 0.89*   -------------------------------------------------------------------------------------------------------------------  Cardiac Enzymes No results for input(s): CKMB, TROPONINI, MYOGLOBIN in the last 168 hours.  Invalid input(s): CK ------------------------------------------------------------------------------------------------------------------ No results found for:  BNP   ---------------------------------------------------------------------------------------------------------------  Urinalysis    Component Value Date/Time   COLORURINE YELLOW 03/21/2019 1018   APPEARANCEUR HAZY (A) 03/21/2019 1018   LABSPEC >1.030 (H) 03/21/2019 1018   PHURINE 6.0 03/21/2019 1018   GLUCOSEU NEGATIVE 03/21/2019 1018   GLUCOSEU NEGATIVE 09/12/2018 1054   HGBUR MODERATE (A) 03/21/2019 1018   BILIRUBINUR SMALL (A) 03/21/2019 1018   BILIRUBINUR neg 12/28/2013 1102   KETONESUR 15 (A) 03/21/2019 1018   PROTEINUR >300 (A) 03/21/2019 1018   UROBILINOGEN 1.0 09/12/2018 1054   NITRITE NEGATIVE 03/21/2019 1018   LEUKOCYTESUR NEGATIVE 03/21/2019 1018    ----------------------------------------------------------------------------------------------------------------   Imaging Results:    CT Angio Chest PE W and/or Wo Contrast  Result Date: 03/21/2019 CLINICAL DATA:  Shortness of breath EXAM: CT ANGIOGRAPHY CHEST  WITH CONTRAST TECHNIQUE: Multidetector CT imaging of the chest was performed using the standard protocol during bolus administration of intravenous contrast. Multiplanar CT image reconstructions and MIPs were obtained to evaluate the vascular anatomy. CONTRAST:  OMNIPAQUE IOHEXOL 350 MG/ML SOLN COMPARISON:  None. FINDINGS: Cardiovascular: Satisfactory opacification of the pulmonary arteries to the segmental level. No evidence of pulmonary embolism. Normal heart size. No pericardial effusion. Mediastinum/Nodes: No enlarged mediastinal, hilar, or axillary lymph nodes. Thyroid gland, trachea, and esophagus demonstrate no significant findings. Lungs/Pleura: Bilateral patchy ground-glass opacities throughout the lungs. No pleural effusion or pneumothorax. Upper Abdomen: No acute abnormality. Musculoskeletal: No chest wall abnormality. No acute or significant osseous findings. Review of the MIP images confirms the above findings. IMPRESSION: 1. No evidence of pulmonary  embolus. 2. Bilateral patchy ground-glass opacities throughout the lungs as can be seen with multilobar pneumonia including atypical viral pneumonia. Electronically Signed   By: Elige Ko   On: 03/21/2019 12:13   DG Chest Portable 1 View  Result Date: 03/21/2019 CLINICAL DATA:  Cough, short of breath for several weeks EXAM: PORTABLE CHEST 1 VIEW COMPARISON:  03/19/2019 FINDINGS: Single frontal view of the chest demonstrates a stable cardiac silhouette. Lung volumes are diminished. Continued crowding of the central pulmonary vasculature with likely bibasilar atelectasis. No effusion or pneumothorax. IMPRESSION: 1. Low lung volumes with bibasilar atelectasis.  Stable exam. Electronically Signed   By: Sharlet Salina M.D.   On: 03/21/2019 11:05    Assessment & Plan:    Principal Problem:   Acute hypoxemic respiratory failure due to severe acute respiratory syndrome coronavirus 2 (SARS-CoV-2) disease (HCC) Active Problems:   Hyperlipidemia   Hyperglycemia   Obesity, Class III, BMI 40-49.9 (morbid obesity) (HCC)     Acute hypoxemic respiratory failure due to SARS-CoV-2 disease: Patient presents as a transfer from [facility] for management of acute respiratory failure with hypoxia secondary to SARS-CoV-2. Date of Dx: 03/21/2019 Oxygen requirements: 3 LPM Antibiotics: Patient had leukocytosis without elevated pro-Cal, received Rocephin/azithromycin in the ER.  Bacterial infection is considered less likely.  Continue oral azithromycin. Diuretics: Clinically euvolemic.  Not indicated at this time. Vitamin C and Zinc: Per protocol Remdesivir: Started on 03/21/2019 Steroids: Started on 03/21/2019 Actemra: Not given yet Convalescent Plasma: Not given yet    Hyperlipidemia: Cardiac diet.  Outpatient follow-up with PCP.    Hyperglycemia: Patient was started on steroids which may exacerbate hyperglycemia, monitor fingersticks before meals and at bedtime.  Sliding scale insulin.    Obesity, Class III, BMI  40-49.9 (morbid obesity):Obesity affects all facets of care.  Likely secondary to excessive caloric intake.  Encourage patient to reduce caloric intake while increasing physical activity and engage in other lifestyle changes that may result in weight loss.   DVT Prophylaxis Lovenox  AM Labs Ordered, also please review Full Orders  Family Communication: Admission, patients condition and plan of care including tests being ordered have been discussed with the patient who indicate understanding and agree with the plan and Code Status.  Code Status Full Code  Likely DC to  Home  Condition GUARDED    Consults called: None    Admission status: Admit to inpatient    Time spent in minutes : 56   Coletta Memos M.D on 03/21/2019 at 10:03 PM  To page go to [email or web address] - password [facility]

## 2019-03-21 NOTE — ED Notes (Signed)
Mary RRT, RCP at [facility] given report to notify patient is on HFNC at [facility] for increase WOB.

## 2019-03-21 NOTE — ED Triage Notes (Addendum)
Cough x "few weeks".  SOB x1 week.  Sat 95% and HR 113 at rest.  Sat 90% and HR 122 when walking back to room from [facility]. Unable to converse while walking.  Tested neg for Covid 2 days ago.

## 2019-03-21 NOTE — Sepsis Progress Note (Signed)
Notified bedside nurse of need to administer antibiotics.  

## 2019-03-21 NOTE — Progress Notes (Signed)
Patient admitted for acute respiratory distress 03/21/19 with findings consistent with COVID pneumonia in spite of a negative COVID test here at the respiratory clinic.

## 2019-03-21 NOTE — ED Provider Notes (Signed)
[facility] EMERGENCY DEPARTMENT Provider Note   CSN: [id number] Arrival date & time: 03/21/19  1001     History Chief Complaint  Patient presents with  . Shortness of Breath    John Porter is a 34 y.o. male with a past medical history significant for hyperlipidemia and obesity who presents to the ED due to worsening shortness of breath x1 week.  Patient notes shortness of breath is worse with exertion.  He admits to a productive cough x3 weeks.  Patient tested negative for Covid 2 days ago.  Patient also admits to subjective fever and chills.  T-max 100 F on Tuesday.  Patient endorses nausea, nonbilious nonbloody emesis, and nonbloody diarrhea since Tuesday.  Patient admits to only one episode of emesis on Tuesday, but admits to roughly 2 episodes of nonbloody diarrhea daily since Tuesday.  Patient denies sick contacts and Covid exposures.  Patient denies history of blood clots, recent surgeries, recent long immobilizations, and hormonal treatment.  Patient denies urinary symptoms.  Patient denies chest pain and lower extremity edema.     Past Medical History:  Diagnosis Date  . Hyperlipidemia 06/01/2011  . Obesity     Patient Active Problem List   Diagnosis Date Noted  . Acute hypoxemic respiratory failure due to COVID-19 (HCC) 03/21/2019  . Allergic rhinitis 06/04/2018  . Flu-like symptoms 12/29/2016  . Hyperglycemia 03/11/2016  . Possible exposure to STD 01/14/2014  . Hyperlipidemia 06/01/2011  . Genital herpes 05/17/2011  . Preventative health care 05/12/2011    History reviewed. No pertinent surgical history.     Family History  Problem Relation Age of Onset  . Hypertension Mother   . Hypertension Father   . Hyperlipidemia Father   . Asthma Father     Social History   Tobacco Use  . Smoking status: Never Smoker  . Smokeless tobacco: Never Used  Substance Use Topics  . Alcohol use: No  . Drug use: No    Home Medications Prior to Admission  medications   Medication Sig Start Date End Date Taking? Authorizing Provider  amoxicillin-clavulanate (AUGMENTIN) 875-125 MG tablet Take 1 tablet by mouth 2 (two) times daily. 03/19/19  Yes Bing Neighbors, FNP  azithromycin (ZITHROMAX) 250 MG tablet Take 500 mg once, then 250 mg for four days 03/16/19  Yes Hawks, Christy A, FNP  HYDROcodone-homatropine (HYCODAN) 5-1.5 MG/5ML syrup Take 5 mLs by mouth every 12 (twelve) hours as needed for cough (avoid driving while taking medication). 03/19/19  Yes Bing Neighbors, FNP  predniSONE (STERAPRED UNI-PAK 21 TAB) 10 MG (21) TBPK tablet Use as directed 03/16/19  Yes Hawks, Christy A, FNP  cyanocobalamin (,VITAMIN B-12,) 1000 MCG/ML injection Take 1 ml IM monthly 10/01/18   Corwin Levins, MD  EPINEPHrine 0.3 mg/0.3 mL IJ SOAJ injection Inject 0.3 mLs (0.3 mg total) into the muscle as needed for anaphylaxis. 12/18/18   Sharlene Dory, DO  Vitamin D, Ergocalciferol, (DRISDOL) 1.25 MG (50000 UT) CAPS capsule Take 1 capsule (50,000 Units total) by mouth every 7 (seven) days. 09/12/18   Corwin Levins, MD    Allergies    Patient has no known allergies.  Review of Systems   Review of Systems  Constitutional: Positive for chills and fever (subjective).  Respiratory: Positive for cough and shortness of breath.   Cardiovascular: Negative for chest pain and leg swelling.  Gastrointestinal: Positive for diarrhea, nausea and vomiting. Negative for abdominal pain.  Genitourinary: Negative for dysuria.  All other systems  reviewed and are negative.   Physical Exam Updated Vital Signs BP (!) 134/91   Pulse (!) 110   Temp 99.8 F (37.7 C) (Oral)   Resp (!) 34   Ht 5\' 8"  (1.727 m)   Wt 129.8 kg   SpO2 99%   BMI 43.52 kg/m   Physical Exam Vitals and nursing note reviewed.  Constitutional:      General: He is not in acute distress.    Appearance: He is ill-appearing.  HENT:     Head: Normocephalic.  Eyes:     Conjunctiva/sclera: Conjunctivae  normal.  Neck:     Comments: No meningismus. Full ROM of neck Cardiovascular:     Rate and Rhythm: Normal rate and regular rhythm.     Pulses: Normal pulses.     Heart sounds: Normal heart sounds. No murmur. No friction rub. No gallop.   Pulmonary:     Effort: Pulmonary effort is normal.     Breath sounds: Normal breath sounds.     Comments: Decreased air movement at the base of the lungs. Increased work of breathing. No accessory muscle usage. Patient coughing during exam. Able to speak in short sentences.   Abdominal:     General: Abdomen is flat. Bowel sounds are normal. There is no distension.     Palpations: Abdomen is soft.     Tenderness: There is no abdominal tenderness. There is no guarding or rebound.  Musculoskeletal:     Cervical back: Neck supple.     Comments: Able to move all 4 extremities without difficulty.  No lower extremity edema.  Negative Homans sign bilaterally.  Skin:    General: Skin is warm and dry.  Neurological:     General: No focal deficit present.     Mental Status: He is alert.  Psychiatric:        Mood and Affect: Mood normal.        Behavior: Behavior normal.     ED Results / Procedures / Treatments   Labs (all labs ordered are listed, but only abnormal results are displayed) Labs Reviewed  RESPIRATORY PANEL BY RT PCR (FLU A&B, COVID) - Abnormal; Notable for the following components:      Result Value   SARS Coronavirus 2 by RT PCR POSITIVE (*)    All other components within normal limits  COMPREHENSIVE METABOLIC PANEL - Abnormal; Notable for the following components:   Sodium 133 (*)    Chloride 97 (*)    Glucose, Bld 123 (*)    Creatinine, Ser 1.39 (*)    Total Protein 8.4 (*)    Alkaline Phosphatase 36 (*)    All other components within normal limits  CBC WITH DIFFERENTIAL/PLATELET - Abnormal; Notable for the following components:   WBC 17.6 (*)    RBC 5.90 (*)    MCV 73.4 (*)    MCH 23.4 (*)    Neutro Abs 14.8 (*)    Abs  Immature Granulocytes 0.15 (*)    All other components within normal limits  URINALYSIS, ROUTINE W REFLEX MICROSCOPIC - Abnormal; Notable for the following components:   APPearance HAZY (*)    Specific Gravity, Urine >1.030 (*)    Hgb urine dipstick MODERATE (*)    Bilirubin Urine SMALL (*)    Ketones, ur 15 (*)    Protein, ur >300 (*)    All other components within normal limits  URINALYSIS, MICROSCOPIC (REFLEX) - Abnormal; Notable for the following components:   Bacteria, UA FEW (*)  All other components within normal limits  APTT - Abnormal; Notable for the following components:   aPTT 38 (*)    All other components within normal limits  PROTIME-INR - Abnormal; Notable for the following components:   Prothrombin Time 15.4 (*)    All other components within normal limits  D-DIMER, QUANTITATIVE (NOT AT [facility]) - Abnormal; Notable for the following components:   D-Dimer, Quant 0.89 (*)    All other components within normal limits  LACTATE DEHYDROGENASE - Abnormal; Notable for the following components:   LDH 224 (*)    All other components within normal limits  FERRITIN - Abnormal; Notable for the following components:   Ferritin 859 (*)    All other components within normal limits  FIBRINOGEN - Abnormal; Notable for the following components:   Fibrinogen 775 (*)    All other components within normal limits  C-REACTIVE PROTEIN - Abnormal; Notable for the following components:   CRP 19.4 (*)    All other components within normal limits  CBG MONITORING, ED - Abnormal; Notable for the following components:   Glucose-Capillary 121 (*)    All other components within normal limits  CULTURE, BLOOD (ROUTINE X 2)  CULTURE, BLOOD (ROUTINE X 2)  URINE CULTURE  SARS CORONAVIRUS 2 BY RT PCR (HOSPITAL ORDER, PERFORMED IN [facility])  LACTIC ACID, PLASMA  PROCALCITONIN  TRIGLYCERIDES  LACTIC ACID, PLASMA    EKG EKG Interpretation  Date/Time:  Thursday March 21 2019  10:12:46 EST Ventricular Rate:  113 PR Interval:    QRS Duration: 87 QT Interval:  318 QTC Calculation: 436 R Axis:   43 Text Interpretation: Sinus tachycardia Probable left atrial enlargement No STEMI Confirmed by Alvester Chou [phone]) on 03/21/2019 10:36:15 AM   Radiology DG Chest 2 View  Result Date: 03/19/2019 CLINICAL DATA:  Cough. EXAM: CHEST - 2 VIEW COMPARISON:  March 21, 2017 FINDINGS: Mildly decreased lung volumes are seen which is likely secondary to the degree of patient inspiration. Subsequent vascular crowding is noted. Very mild atelectasis is suspected within the bilateral lung bases. There is no evidence of a pleural effusion or pneumothorax. The heart size and mediastinal contours are within normal limits. The visualized skeletal structures are unremarkable. IMPRESSION: 1. Suboptimal patient inspiration with decreased lung volumes and mild bibasilar atelectasis. Electronically Signed   By: Aram Candela M.D.   On: 03/19/2019 17:08   CT Angio Chest PE W and/or Wo Contrast  Result Date: 03/21/2019 CLINICAL DATA:  Shortness of breath EXAM: CT ANGIOGRAPHY CHEST WITH CONTRAST TECHNIQUE: Multidetector CT imaging of the chest was performed using the standard protocol during bolus administration of intravenous contrast. Multiplanar CT image reconstructions and MIPs were obtained to evaluate the vascular anatomy. CONTRAST:  OMNIPAQUE IOHEXOL 350 MG/ML SOLN COMPARISON:  None. FINDINGS: Cardiovascular: Satisfactory opacification of the pulmonary arteries to the segmental level. No evidence of pulmonary embolism. Normal heart size. No pericardial effusion. Mediastinum/Nodes: No enlarged mediastinal, hilar, or axillary lymph nodes. Thyroid gland, trachea, and esophagus demonstrate no significant findings. Lungs/Pleura: Bilateral patchy ground-glass opacities throughout the lungs. No pleural effusion or pneumothorax. Upper Abdomen: No acute abnormality. Musculoskeletal: No chest wall  abnormality. No acute or significant osseous findings. Review of the MIP images confirms the above findings. IMPRESSION: 1. No evidence of pulmonary embolus. 2. Bilateral patchy ground-glass opacities throughout the lungs as can be seen with multilobar pneumonia including atypical viral pneumonia. Electronically Signed   By: Elige Ko   On: 03/21/2019 12:13  DG Chest Portable 1 View  Result Date: 03/21/2019 CLINICAL DATA:  Cough, short of breath for several weeks EXAM: PORTABLE CHEST 1 VIEW COMPARISON:  03/19/2019 FINDINGS: Single frontal view of the chest demonstrates a stable cardiac silhouette. Lung volumes are diminished. Continued crowding of the central pulmonary vasculature with likely bibasilar atelectasis. No effusion or pneumothorax. IMPRESSION: 1. Low lung volumes with bibasilar atelectasis.  Stable exam. Electronically Signed   By: Randa Ngo M.D.   On: 03/21/2019 11:05    Procedures Procedures (including critical care time)  Medications Ordered in ED Medications  cefTRIAXone (ROCEPHIN) 2 g in sodium chloride 0.9 % 100 mL IVPB (0 g Intravenous Stopped 03/21/19 1308)  azithromycin (ZITHROMAX) 500 mg in sodium chloride 0.9 % 250 mL IVPB (0 mg Intravenous Stopped 03/21/19 1347)  methylPREDNISolone sodium succinate (SOLU-MEDROL) 125 mg/2 mL injection 60 mg (has no administration in time range)  sodium chloride 0.9 % bolus 1,000 mL (0 mLs Intravenous Stopped 03/21/19 1459)    And  sodium chloride 0.9 % bolus 1,000 mL (0 mLs Intravenous Stopped 03/21/19 1459)    And  sodium chloride 0.9 % bolus 1,000 mL (0 mLs Intravenous Stopped 03/21/19 1346)    And  sodium chloride 0.9 % bolus 1,000 mL (0 mLs Intravenous Stopped 03/21/19 1346)  iohexol (OMNIPAQUE) 350 MG/ML injection 100 mL (100 mLs Intravenous Contrast Given 03/21/19 1205)    ED Course  I have reviewed the triage vital signs and the nursing notes.  Pertinent labs & imaging results that were available during my care of the patient were  reviewed by me and considered in my medical decision making (see chart for details).  Clinical Course as of Mar 20 1654  Thu Mar 21, 2019  1128 Performed initial evaluation. Code sepsis called on patient. Suspect respiratory source. Will treat for CAP. Discussed case with Dr. Langston Masker who agrees with assessment and plan.    [CA]  1330 Patient seen myself as was PA provider.  Briefly is a 34 year old male with no reported significant past medical history presents to the ED with cough and shortness of breath for approximate 1 week.  He reports that several coworkers for test positive for [employer].  He has had cough and malaise.  He has diminished appetite.  Here in the ED he was satting 90% on room air, improved to 95% on 2 L nasal cannula.  He was mildly hypertensive and tachycardic.  He does appear dry on exam.  He has a white count of 17.6.  X-ray shows bilateral infiltrates consistent with viral bacterial pneumonia.  Patient was treated with a round of ceftriaxone azithromycin to cover for community-acquired pneumonia.  I suspect this is likely Covid, but until we have the results, I believe it is reasonable to treat him for bacterial sources and sepsis.  Will admit   [MT]  1537 Received call from admitting RN who notes they are waiting for COVID status to determine where patient will be admitted. Will repage consult once COVID test available.    [CA]  1637 SARS Coronavirus 2 by RT PCR(!): POSITIVE [MT]  1652 Spoke to Dr. Cruzita Lederer who agrees to admit patient for further COVID treatment.    [CA]    Clinical Course User Index [CA] Suzy Bouchard, PA-C [MT] Langston Masker Carola Rhine, MD   MDM Rules/Calculators/A&P                     34 year old male presents to the ED due to shortness of  breath, cough, nausea, vomiting, and diarrhea.  Cough started 3 weeks ago with progressively worsening shortness of breath for the past week.  Patient tested negative for Covid 2 days ago.  Denies sick contacts and Covid  exposures.  Upon arrival, patient borderline febrile at 99.8, tachycardic at 110, and tachypneic at 36.  Physical exam significant for decreased air movement at the base of the lungs.  No lower extremity edema.  Negative Homans sign bilaterally. Code sepsis initiated after initial evaluation for presumptive CAP.  While patient was being brought back to his room from the waiting room his O2 sats dropped to 90% with a heart rate of 122. Suspect symptoms related to CAP vs. COVID.   CBC significant for leukocytosis at 17.6.  CMP significant for AKI with creatinine at 1.39, hyponatremia at 133, but otherwise reassuring.  Normal lactic acid.  UA significant for ketonuria and proteinuria likely due to dehydration.  No signs of infection.  Chest x-ray personally reviewed which is significant for low lung volumes with bibasilar atelectasis.  EKG personally reviewed which demonstrates sinus tachycardia with no signs of ischemia.  Will obtain CTA to rule out PE and to evaluate for potential pneumonia.  CTA personally reviewed which demonstrates: 1. No evidence of pulmonary embolus.  2. Bilateral patchy ground-glass opacities throughout the lungs as  can be seen with multilobar pneumonia including atypical viral  pneumonia.   Given patient's CTA results. Suspect symptoms related to COVID pneumonia. Will obtain COVID pre-admission labs given patient is hypoxic with ambulation. Discussed case with Dr. Renaye Rakers who evaluated patient at bedside and agrees with assessment and plan. Will place consult for hospital admission given hypoxia and possible sepsis. Discussed case with Dr. Elvera Lennox who agrees to admit patient for further COVID treatment.   John Porter was evaluated in Emergency Department on 03/21/2019 for the symptoms described in the history of present illness. He was evaluated in the context of the global COVID-19 pandemic, which necessitated consideration that the patient might be at risk for infection with the  SARS-CoV-2 virus that causes COVID-19. Institutional protocols and algorithms that pertain to the evaluation of patients at risk for COVID-19 are in a state of rapid change based on information released by regulatory bodies including the CDC and federal and state organizations. These policies and algorithms were followed during the patient's care in the ED. Final Clinical Impression(s) / ED Diagnoses Final diagnoses:  Acute respiratory distress syndrome (ARDS) due to COVID-19 virus [facility])    Rx / DC Orders ED Discharge Orders    None       Jesusita Oka 03/21/19 1656    Terald Sleeper, MD 03/21/19 1911

## 2019-03-22 LAB — CBC WITH DIFFERENTIAL/PLATELET
Abs Immature Granulocytes: 0.08 10*3/uL — ABNORMAL HIGH (ref 0.00–0.07)
Basophils Absolute: 0 10*3/uL (ref 0.0–0.1)
Basophils Relative: 0 %
Eosinophils Absolute: 0 10*3/uL (ref 0.0–0.5)
Eosinophils Relative: 0 %
HCT: 35.6 % — ABNORMAL LOW (ref 39.0–52.0)
Hemoglobin: 11.3 g/dL — ABNORMAL LOW (ref 13.0–17.0)
Immature Granulocytes: 1 %
Lymphocytes Relative: 10 %
Lymphs Abs: 1.3 10*3/uL (ref 0.7–4.0)
MCH: 23.4 pg — ABNORMAL LOW (ref 26.0–34.0)
MCHC: 31.7 g/dL (ref 30.0–36.0)
MCV: 73.9 fL — ABNORMAL LOW (ref 80.0–100.0)
Monocytes Absolute: 0.1 10*3/uL (ref 0.1–1.0)
Monocytes Relative: 1 %
Neutro Abs: 11.2 10*3/uL — ABNORMAL HIGH (ref 1.7–7.7)
Neutrophils Relative %: 88 %
Platelets: 188 10*3/uL (ref 150–400)
RBC: 4.82 MIL/uL (ref 4.22–5.81)
RDW: 14.6 % (ref 11.5–15.5)
WBC: 12.7 10*3/uL — ABNORMAL HIGH (ref 4.0–10.5)
nRBC: 0 % (ref 0.0–0.2)

## 2019-03-22 LAB — COMPREHENSIVE METABOLIC PANEL
ALT: 23 U/L (ref 0–44)
AST: 31 U/L (ref 15–41)
Albumin: 3 g/dL — ABNORMAL LOW (ref 3.5–5.0)
Alkaline Phosphatase: 47 U/L (ref 38–126)
Anion gap: 10 (ref 5–15)
BUN: 13 mg/dL (ref 6–20)
CO2: 20 mmol/L — ABNORMAL LOW (ref 22–32)
Calcium: 8.4 mg/dL — ABNORMAL LOW (ref 8.9–10.3)
Chloride: 108 mmol/L (ref 98–111)
Creatinine, Ser: 1.09 mg/dL (ref 0.61–1.24)
GFR calc Af Amer: >60 mL/min (ref >60)
GFR calc non Af Amer: >60 mL/min (ref >60)
Glucose, Bld: 129 mg/dL — ABNORMAL HIGH (ref 70–99)
Potassium: 4.6 mmol/L (ref 3.5–5.1)
Sodium: 138 mmol/L (ref 135–145)
Total Bilirubin: 0.3 mg/dL (ref 0.3–1.2)
Total Protein: 7.3 g/dL (ref 6.5–8.1)

## 2019-03-22 LAB — GLUCOSE, CAPILLARY: Glucose-Capillary: 141 mg/dL — ABNORMAL HIGH (ref 70–99)

## 2019-03-22 LAB — C-REACTIVE PROTEIN: CRP: 19.3 mg/dL — ABNORMAL HIGH (ref <1.0)

## 2019-03-22 LAB — URINE CULTURE: Culture: NO GROWTH

## 2019-03-22 LAB — FERRITIN: Ferritin: 985 ng/mL — ABNORMAL HIGH (ref 24–336)

## 2019-03-22 LAB — D-DIMER, QUANTITATIVE: D-Dimer, Quant: 0.99 ug/mL-FEU — ABNORMAL HIGH (ref 0.00–0.50)

## 2019-03-22 LAB — HIV ANTIBODY (ROUTINE TESTING W REFLEX): HIV Screen 4th Generation wRfx: NONREACTIVE

## 2019-03-22 MED ORDER — METHYLPREDNISOLONE SODIUM SUCC 125 MG IJ SOLR
80.0000 mg | Freq: Two times a day (BID) | INTRAMUSCULAR | Status: DC
  Administered 2019-03-22 – 2019-03-23 (×2): 80 mg via INTRAVENOUS
  Filled 2019-03-22 (×2): qty 2

## 2019-03-22 MED ORDER — INSULIN ASPART 100 UNIT/ML ~~LOC~~ SOLN
0.0000 [IU] | Freq: Three times a day (TID) | SUBCUTANEOUS | Status: DC
  Administered 2019-03-22 – 2019-03-23 (×2): 1 [IU] via SUBCUTANEOUS

## 2019-03-22 MED ORDER — ENSURE MAX PROTEIN PO LIQD
11.0000 [oz_av] | Freq: Two times a day (BID) | ORAL | Status: DC
  Administered 2019-03-22: 11 [oz_av] via ORAL
  Filled 2019-03-22 (×4): qty 330

## 2019-03-22 MED ORDER — INSULIN ASPART 100 UNIT/ML ~~LOC~~ SOLN: 0.0000 [IU] | Freq: Every day | SUBCUTANEOUS | Status: DC

## 2019-03-22 NOTE — Plan of Care (Signed)
New admit during shift, arrived on 6L HFNC, able to be weaned down to 2L @ this time, HR elevated in lower 100's, Respirations elevated, Complaints of dyspnea on exertion, received loading dose of remdesivir during shift, adequate urine output, continue poc Problem: Clinical Measurements: Goal: Diagnostic test results will improve Outcome: Progressing Goal: Respiratory complications will improve Outcome: Progressing Goal: Cardiovascular complication will be avoided Outcome: Progressing   Problem: Activity: Goal: Risk for activity intolerance will decrease Outcome: Progressing   Problem: Nutrition: Goal: Adequate nutrition will be maintained Outcome: Progressing   Problem: Safety: Goal: Ability to remain free from injury will improve Outcome: Progressing

## 2019-03-22 NOTE — Progress Notes (Signed)
Initial Nutrition Assessment RD working remotely.  DOCUMENTATION CODES:   Morbid obesity  INTERVENTION:    Ensure Max po BID, each supplement provides 150 kcal and 30 grams of protein.   NUTRITION DIAGNOSIS:   Increased nutrient needs related to acute illness, catabolic illness(COVID-19) as evidenced by estimated needs.  GOAL:   Patient will meet greater than or equal to 90% of their needs  MONITOR:   PO intake, Supplement acceptance, Labs  REASON FOR ASSESSMENT:   Malnutrition Screening Tool    ASSESSMENT:   34 yo male admitted with SOB, COVID-19 positive. PMH includes HLD, obesity, prediabetes.  Patient has had URI symptoms for a week, causing decreased appetite and decreased PO intake. Usual weights reviewed. He has lost 5% of usual weight within the past 3 months which is not significant for the time frame. While weight loss would be beneficial for this patient, it should not be the goal of nutrition care at this time during his acute illness with increased nutrition requirements.    Patient is on a regular diet, no meal intakes recorded. Suspect poor intake will continue with ongoing COVID symptoms.   Labs reviewed.  CBG's: 121 (2/4)  Medications reviewed and include vitamin C, solumedrol, zinc sulfate, remdesivir.   NUTRITION - FOCUSED PHYSICAL EXAM:  unable to complete  Diet Order:   Diet Order            Diet regular Room service appropriate? Yes; Fluid consistency: Thin  Diet effective now              EDUCATION NEEDS:   Not appropriate for education at this time  Skin:  Skin Assessment: Reviewed RN Assessment  Last BM:  no BM documented  Height:   Ht Readings from Last 1 Encounters:  03/21/19 5\' 8"  (1.727 m)    Weight:   Wt Readings from Last 1 Encounters:  03/21/19 129.8 kg    Ideal Body Weight:  70 kg  BMI:  Body mass index is 43.52 kg/m.  Estimated Nutritional Needs:   Kcal:  2500-2700  Protein:  120-140 gm  Fluid:   >/= 2.5 L    05/19/19, RD, LDN, CNSC Contact information can be found in Amion.

## 2019-03-22 NOTE — Progress Notes (Signed)
   03/22/19 0741  Family/Significant Other Communication  Family/Significant Other Update Called;Updated  Called and updated father per pt request. All questions asked and all questions answered.

## 2019-03-22 NOTE — Progress Notes (Signed)
Ambulation Note  Saturation Pre: 96% on RA  Ambulation Distance: 200 ft  Saturation During Ambulation: 90-94% on RA  Notes: Pt walked independently, he was SOB but was able to bring his RR down through PLB. Pt returned to recliner with call bell within reach, SpO2 94% on RA.  Alisia Ferrari, MS, ACSM CEP 1:41 PM 03/22/2019

## 2019-03-22 NOTE — Progress Notes (Addendum)
PROGRESS NOTE  John Porter  AQT:[id number] DOB: 1985/07/09 DOA: 03/21/2019 PCP: Biagio Borg, MD   Brief Narrative: John Porter is a 34 y.o. male with a history of prediabetes who presented to [facility] ED 2/4 with about a week of myalgias, cough, decreased appetite, and progressive shortness of breath. He had been prescribed azithromycin and prednisone after a virtual visit on 1/30 but continued to feel worse prompting repeat e-visit and subsequent office visit in respiratory clinic 2/2 where augmentin was started while covid testing pending. He had multiple coworkers recently test positive for covid, though when evaluated 2/2 his test was negative. Pulse oximetry in the clinic was fair, though he was given return precautions including the recommendation to present urgently if pulse oximetry at home went low. When he noted home pulse oximetry reading in 80%'s he presented to [facility]. On presentation he had borderline oxygen saturations, 90% on room air, was tachycardic and hypertensive. SARS-CoV-2 PCR was positive, CXR showed bibasilar opacities and subsequent CTA chest revealed bilateral diffuse GGOs felt to be consistent with covid pneumonia without evidence of PE. WBC elevated at 17.6k so ceftriaxone and azithromycin initially given in the setting of code sepsis which was cancelled once covid testing was positive. Admission to [facility] was requested, remdesivir and solumedrol started.  Assessment & Plan: Principal Problem:   Acute hypoxemic respiratory failure due to severe acute respiratory syndrome coronavirus 2 (SARS-CoV-2) disease (HCC) Active Problems:   Hyperlipidemia   Hyperglycemia   Obesity, Class III, BMI 40-49.9 (morbid obesity) ([place])  Acute hypoxemic respiratory failure due to covid-19 pneumonia: SARS-CoV-2 PCR positive on 2/4. Flu negative. - Continue remdesivir x5 days (2/4 - 2/8) - Steroids started, will augment dose with severely elevated CRP (19). Fortunately he is weaning from  supplemental oxygen though remains at very high risk of progression to ARDS - Vitamin C, zinc - Tylenol and antitussives prn - Continue airborne, contact precautions for 21 days from positive testing. - Check CBC w/diff, CMP, CRP daily - Will give weight-based enoxaparin prophylactic dose.  - Maintain euvolemia/net negative.  - Avoid NSAIDs  - PCT 0.2, leukocytosis likely due to steroids, no lobar consolidation, has already received a total of a full course of antibiotics for CAP, so will not continue antibiotics at this time. - Strongly encourage incentive spirometry with bibasilar atelectasis. Continue OOB often, prone is able.  AKI: SCr 1.39 in ED, improved with IVF. Due primarily to dehydration, possibly an element of ATN since UA with granular casts. Though in the setting of poor po intake, noting ketonuria without glucosuria consistent with fasting state, ketosis explains elevated anion gap. - Received 30cc/kg normal saline in ED.  Prediabetes with steroid-induced hyperglycemia: HbA1c 5.9% previously.  - Continue SSI and modify as needed   Morbid obesity: BMI 43.5. Noted.  - Dietitian consulted, will supplement protein during catabolic illness.  DVT prophylaxis: Lovenox 0.43m/kg q24h Code Status: Full Family Communication: None at bedside, pt to relay plan of care Disposition Plan: Patient presented from home and remains functionally able to return home once showing sustained clinical improvement.   Consultants:   None  Procedures:   None  Antimicrobials:  Remdesivir 2/4 - 2/8.  Ceftriaxone, azithromycin 2/4 - 2/5.   Subjective: Feels better than at admission, tired, shortness of breath still present at rest and with exertion. No definite chest pain. When rising from laying down for exam he becomes severely short of breath requiring a minute of intentional deep breathing to gradually improve.  Objective:  Vitals:   03/22/19 0206 03/22/19 0450 03/22/19 0700 03/22/19  0716  BP:  (!) 138/97  (!) 137/96  Pulse: 93 94  94  Resp: (!) 22 (!) 33  (!) 32  Temp:  98.5 F (36.9 C) 98 F (36.7 C)   TempSrc:   Oral   SpO2: 100% 100%  100%  Weight:      Height:        Intake/Output Summary (Last 24 hours) at 03/22/2019 1510 Last data filed at 03/22/2019 1300 Gross per 24 hour  Intake 1330 ml  Output 2400 ml  Net -1070 ml   Filed Weights   03/21/19 1014  Weight: 129.8 kg   Gen: 34 y.o. male in no distress Pulm: Non-labored breathing at rest, tachypneic with any exertion. Clear to auscultation bilaterally, diminished at bases. CV: Regular rate and rhythm. No murmur, rub, or gallop. No JVD, no pedal edema. GI: Abdomen non-tender, non-distended, with normoactive bowel sounds.   Ext: Warm, no deformities Skin: No rashes, lesions or ulcers on visualized skin Neuro: Alert and oriented. No focal neurological deficits. Psych: Judgement and insight appear normal. Mood & affect appropriate.   Data Reviewed: I have personally reviewed following labs and imaging studies  CBC: Recent Labs  Lab 03/21/19 1038 03/22/19 0121  WBC 17.6* 12.7*  NEUTROABS 14.8* 11.2*  HGB 13.8 11.3*  HCT 43.3 35.6*  MCV 73.4* 73.9*  PLT 205 734   Basic Metabolic Panel: Recent Labs  Lab 03/21/19 1038 03/22/19 0121  NA 133* 138  K 3.8 4.6  CL 97* 108  CO2 23 20*  GLUCOSE 123* 129*  BUN 15 13  CREATININE 1.39* 1.09  CALCIUM 8.9 8.4*   GFR: Estimated Creatinine Clearance: 125.6 mL/min (by C-G formula based on SCr of 1.09 mg/dL). Liver Function Tests: Recent Labs  Lab 03/21/19 1038 03/22/19 0121  AST 36 31  ALT 25 23  ALKPHOS 36* 47  BILITOT 1.0 0.3  PROT 8.4* 7.3  ALBUMIN 3.6 3.0*   No results for input(s): LIPASE, AMYLASE in the last 168 hours. No results for input(s): AMMONIA in the last 168 hours. Coagulation Profile: Recent Labs  Lab 03/21/19 1038  INR 1.2   Cardiac Enzymes: No results for input(s): CKTOTAL, CKMB, CKMBINDEX, TROPONINI in the last 168  hours. BNP (last 3 results) No results for input(s): PROBNP in the last 8760 hours. HbA1C: No results for input(s): HGBA1C in the last 72 hours. CBG: Recent Labs  Lab 03/21/19 1032  GLUCAP 121*   Lipid Profile: Recent Labs    03/21/19 1315  TRIG 137   Thyroid Function Tests: No results for input(s): TSH, T4TOTAL, FREET4, T3FREE, THYROIDAB in the last 72 hours. Anemia Panel: Recent Labs    03/21/19 1315 03/22/19 0121  FERRITIN 859* 985*   Urine analysis:    Component Value Date/Time   COLORURINE YELLOW 03/21/2019 1018   APPEARANCEUR HAZY (A) 03/21/2019 1018   LABSPEC >1.030 (H) 03/21/2019 1018   PHURINE 6.0 03/21/2019 1018   GLUCOSEU NEGATIVE 03/21/2019 1018   GLUCOSEU NEGATIVE 09/12/2018 1054   HGBUR MODERATE (A) 03/21/2019 1018   BILIRUBINUR SMALL (A) 03/21/2019 1018   BILIRUBINUR neg 12/28/2013 1102   KETONESUR 15 (A) 03/21/2019 1018   PROTEINUR >300 (A) 03/21/2019 1018   UROBILINOGEN 1.0 09/12/2018 1054   NITRITE NEGATIVE 03/21/2019 1018   LEUKOCYTESUR NEGATIVE 03/21/2019 1018   Recent Results (from the past 240 hour(s))  Novel Coronavirus, NAA ([facility])     Status: None   Collection Time:  03/19/19  5:57 PM   Specimen: Nasopharyngeal(NP) swabs in vial transport medium   NASOPHARYNGE  IS THIS  Result Value Ref Range Status   SARS-CoV-2, NAA Not Detected Not Detected Final    Comment: This nucleic acid amplification test was developed and its performance characteristics determined by [employer]. Nucleic acid amplification tests include RT-PCR and TMA. This test has not been FDA cleared or approved. This test has been authorized by FDA under an Emergency Use Authorization (EUA). This test is only authorized for the duration of time the declaration that circumstances exist justifying the authorization of the emergency use of in vitro diagnostic tests for detection of SARS-CoV-2 virus and/or diagnosis of COVID-19 infection under section 564(b)(1) of  the Act, 21 U.S.C. 503UUE-2(C) (1), unless the authorization is terminated or revoked sooner. When diagnostic testing is negative, the possibility of a false negative result should be considered in the context of a patient's recent exposures and the presence of clinical signs and symptoms consistent with COVID-19. An individual without symptoms of COVID-19 and who is not shedding SARS-CoV-2 virus wo uld expect to have a negative (not detected) result in this assay.   Urine culture     Status: None   Collection Time: 03/21/19 10:18 AM   Specimen: In/Out Cath Urine  Result Value Ref Range Status   Specimen Description   Final    IN/OUT CATH URINE Performed at [facility], [address] [place], [place] [address]    Special Requests   Final    NONE Performed at [facility], [address] [place], [place] [address]    Culture   Final    NO GROWTH Performed at [facility], [place] [address] [place], [place] [address]    Report Status 03/22/2019 FINAL  Final  Blood Culture (routine x 2)     Status: None (Preliminary result)   Collection Time: 03/21/19 12:00 PM   Specimen: Right Antecubital; Blood  Result Value Ref Range Status   Specimen Description   Final    RIGHT ANTECUBITAL Performed at [facility], [address] [place], [place] [address]    Special Requests   Final    BOTTLES DRAWN AEROBIC AND ANAEROBIC Blood Culture adequate volume Performed at [facility], [address] [place], [place] [address]    Culture   Final    NO GROWTH < 24 HOURS Performed at [facility], [place] [address] [place], [place] [address]    Report Status PENDING  Incomplete  Blood Culture (routine x 2)     Status: None (Preliminary result)   Collection Time: 03/21/19 12:15 PM   Specimen: Left Antecubital; Blood  Result Value Ref Range Status   Specimen Description   Final    LEFT ANTECUBITAL Performed at [facility],  [address] [place], [place] [address]    Special Requests   Final    BOTTLES DRAWN AEROBIC AND ANAEROBIC Blood Culture adequate volume Performed at [facility], [address] [place], [place] [address]    Culture   Final    NO GROWTH < 24 HOURS Performed at [facility], [place] [address], [place], [place] [address]    Report Status PENDING  Incomplete  Respiratory Panel by RT PCR (Flu A&B, Covid) - Nasopharyngeal Swab     Status: Abnormal   Collection Time: 03/21/19  1:15 PM  Specimen: Nasopharyngeal Swab  Result Value Ref Range Status   SARS Coronavirus 2 by RT PCR POSITIVE (A) NEGATIVE Final    Comment: RESULT CALLED TO, READ BACK BY AND VERIFIED WITH: RBeverley Fiedler RN 16:25 03/21/19 ([email or web address]) (NOTE) SARS-CoV-2 target nucleic acids are DETECTED. SARS-CoV-2 RNA is generally detectable in upper respiratory specimens  during the acute phase of infection. Positive results are indicative of the presence of the identified virus, but do not rule out bacterial infection or co-infection with other pathogens not detected by the test. Clinical correlation with patient history and other diagnostic information is necessary to determine patient infection status. The expected result is Negative. Fact Sheet for Patients:  PinkCheek.be Fact Sheet for Healthcare Providers: GravelBags.it This test is not yet approved or cleared by the [place] FDA and  has been authorized for detection and/or diagnosis of SARS-CoV-2 by FDA under an Emergency Use Authorization (EUA).  This EUA will remain in effect (meaning this test can be used) f or the duration of  the COVID-19 declaration under Section 564(b)(1) of the Act, 21 U.S.C. section 360bbb-3(b)(1), unless the authorization is terminated or revoked sooner.    Influenza A by PCR NEGATIVE NEGATIVE Final   Influenza B by PCR NEGATIVE NEGATIVE Final    Comment: (NOTE)  The Xpert Xpress SARS-CoV-2/FLU/RSV assay is intended as an aid in  the diagnosis of influenza from Nasopharyngeal swab specimens and  should not be used as a sole basis for treatment. Nasal washings and  aspirates are unacceptable for Xpert Xpress SARS-CoV-2/FLU/RSV  testing. Fact Sheet for Patients: PinkCheek.be Fact Sheet for Healthcare Providers: GravelBags.it This test is not yet approved or cleared by the [place] FDA and  has been authorized for detection and/or diagnosis of SARS-CoV-2 by  FDA under an Emergency Use Authorization (EUA). This EUA will remain  in effect (meaning this test can be used) for the duration of the  Covid-19 declaration under Section 564(b)(1) of the Act, 21  U.S.C. section 360bbb-3(b)(1), unless the authorization is  terminated or revoked. Performed at [facility], [place] [address] [place], [place] [address]   SARS Coronavirus 2 by RT PCR (hospital order, performed in [facility] hospital lab) Nasopharyngeal Nasal Swab (BD Veritor Kit)     Status: Abnormal   Collection Time: 03/21/19  4:13 PM   Specimen: Nasal Swab (BD Veritor Kit); Nasopharyngeal  Result Value Ref Range Status   SARS Coronavirus 2 POSITIVE (A) NEGATIVE Final    Comment: RESULT CALLED TO, READ BACK BY AND VERIFIED WITH:  MARCUM RUTH '@[id number]'  ON 03/21/2019, [email or web address] Performed at [facility], [address] [place], [place] [address]       Radiology Studies: CT Angio Chest PE W and/or Wo Contrast  Result Date: 03/21/2019 CLINICAL DATA:  Shortness of breath EXAM: CT ANGIOGRAPHY CHEST WITH CONTRAST TECHNIQUE: Multidetector CT imaging of the chest was performed using the standard protocol during bolus administration of intravenous contrast. Multiplanar CT image reconstructions and MIPs were obtained to evaluate the vascular anatomy. CONTRAST:  161m OMNIPAQUE IOHEXOL 350 MG/ML SOLN COMPARISON:  None. FINDINGS:  Cardiovascular: Satisfactory opacification of the pulmonary arteries to the segmental level. No evidence of pulmonary embolism. Normal heart size. No pericardial effusion. Mediastinum/Nodes: No enlarged mediastinal, hilar, or axillary lymph nodes. Thyroid gland, trachea, and esophagus demonstrate no significant findings. Lungs/Pleura: Bilateral patchy ground-glass opacities throughout the lungs. No pleural effusion or pneumothorax. Upper Abdomen: No acute abnormality. Musculoskeletal: No chest wall abnormality. No acute or  significant osseous findings. Review of the MIP images confirms the above findings. IMPRESSION: 1. No evidence of pulmonary embolus. 2. Bilateral patchy ground-glass opacities throughout the lungs as can be seen with multilobar pneumonia including atypical viral pneumonia. Electronically Signed   By: Kathreen Devoid   On: 03/21/2019 12:13   DG Chest Portable 1 View  Result Date: 03/21/2019 CLINICAL DATA:  Cough, short of breath for several weeks EXAM: PORTABLE CHEST 1 VIEW COMPARISON:  03/19/2019 FINDINGS: Single frontal view of the chest demonstrates a stable cardiac silhouette. Lung volumes are diminished. Continued crowding of the central pulmonary vasculature with likely bibasilar atelectasis. No effusion or pneumothorax. IMPRESSION: 1. Low lung volumes with bibasilar atelectasis.  Stable exam. Electronically Signed   By: Randa Ngo M.D.   On: 03/21/2019 11:05    Scheduled Meds: . vitamin C  500 mg Oral Daily  . docusate sodium  100 mg Oral Daily  . enoxaparin (LOVENOX) injection  65 mg Subcutaneous Q24H  . Ipratropium-Albuterol  1 puff Inhalation Q6H  . methylPREDNISolone (SOLU-MEDROL) injection  80 mg Intravenous Q12H  . Ensure Max Protein  11 oz Oral BID  . zinc sulfate  220 mg Oral Daily   Continuous Infusions: . remdesivir 100 mg in NS 100 mL 100 mg (03/22/19 0914)     LOS: 1 day   Time spent: 35 minutes.  Patrecia Pour, MD [facility] [email or web address]  03/22/2019, 3:10 PM

## 2019-03-22 NOTE — Progress Notes (Signed)
SATURATION QUALIFICATIONS: (This note is used to comply with regulatory documentation for home oxygen)  Patient Saturations on Room Air at Rest = 96%  Patient Saturations on Room Air while Ambulating = 90-94%  Patient Saturations on NA Liters of oxygen while Ambulating = NA%  Please briefly explain why patient needs home oxygen: Pt does not require home oxygen.

## 2019-03-22 NOTE — Plan of Care (Signed)
Went over Plan of Care with pt and wife. All questions asked and answered.

## 2019-03-22 NOTE — Progress Notes (Signed)
   Vital Signs MEWS/VS Documentation      03/22/2019 0700 03/22/2019 0716 03/22/2019 1623 03/22/2019 1929   MEWS Score:  2  2  2  2    MEWS Score Color:  Yellow  Yellow  Yellow  Yellow   Resp:  --  (!) 32  (!) 28  (!) 24   Pulse:  --  94  100  (!) 102   BP:  --  (!) 137/96  138/89  (!) 149/94   Temp:  98 F (36.7 C)  --  98.6 F (37 C)  98.9 F (37.2 C)   O2 Device:  --  Nasal Cannula  Room Air  Room Air   O2 Flow Rate (L/min):  --  2 L/min  --  --   Level of Consciousness:  --  Alert  Alert  Alert       Yellow Mews score due to tachypnea and tachycardia - not acute; present  since admission 03/21/19 and has since improved. No additional interventions for now, will continue to monitor.    05/19/19 03/22/2019,7:53 PM

## 2019-03-23 LAB — CBC WITH DIFFERENTIAL/PLATELET
Abs Immature Granulocytes: 0.11 10*3/uL — ABNORMAL HIGH (ref 0.00–0.07)
Basophils Absolute: 0 10*3/uL (ref 0.0–0.1)
Basophils Relative: 0 %
Eosinophils Absolute: 0 10*3/uL (ref 0.0–0.5)
Eosinophils Relative: 0 %
HCT: 40.1 % (ref 39.0–52.0)
Hemoglobin: 12.8 g/dL — ABNORMAL LOW (ref 13.0–17.0)
Immature Granulocytes: 1 %
Lymphocytes Relative: 12 %
Lymphs Abs: 1.8 10*3/uL (ref 0.7–4.0)
MCH: 23.2 pg — ABNORMAL LOW (ref 26.0–34.0)
MCHC: 31.9 g/dL (ref 30.0–36.0)
MCV: 72.6 fL — ABNORMAL LOW (ref 80.0–100.0)
Monocytes Absolute: 0.3 10*3/uL (ref 0.1–1.0)
Monocytes Relative: 2 %
Neutro Abs: 12.3 10*3/uL — ABNORMAL HIGH (ref 1.7–7.7)
Neutrophils Relative %: 85 %
Platelets: 235 10*3/uL (ref 150–400)
RBC: 5.52 MIL/uL (ref 4.22–5.81)
RDW: 14.2 % (ref 11.5–15.5)
WBC: 14.5 10*3/uL — ABNORMAL HIGH (ref 4.0–10.5)
nRBC: 0 % (ref 0.0–0.2)

## 2019-03-23 LAB — HEMOGLOBIN A1C
Hgb A1c MFr Bld: 5.8 % — ABNORMAL HIGH (ref 4.8–5.6)
Mean Plasma Glucose: 119.76 mg/dL

## 2019-03-23 LAB — COMPREHENSIVE METABOLIC PANEL
ALT: 31 U/L (ref 0–44)
AST: 27 U/L (ref 15–41)
Albumin: 2.9 g/dL — ABNORMAL LOW (ref 3.5–5.0)
Alkaline Phosphatase: 49 U/L (ref 38–126)
Anion gap: 15 (ref 5–15)
BUN: 21 mg/dL — ABNORMAL HIGH (ref 6–20)
CO2: 21 mmol/L — ABNORMAL LOW (ref 22–32)
Calcium: 8.4 mg/dL — ABNORMAL LOW (ref 8.9–10.3)
Chloride: 101 mmol/L (ref 98–111)
Creatinine, Ser: 0.98 mg/dL (ref 0.61–1.24)
GFR calc Af Amer: >60 mL/min (ref >60)
GFR calc non Af Amer: >60 mL/min (ref >60)
Glucose, Bld: 141 mg/dL — ABNORMAL HIGH (ref 70–99)
Potassium: 4.1 mmol/L (ref 3.5–5.1)
Sodium: 137 mmol/L (ref 135–145)
Total Bilirubin: 0.5 mg/dL (ref 0.3–1.2)
Total Protein: 7.3 g/dL (ref 6.5–8.1)

## 2019-03-23 LAB — GLUCOSE, CAPILLARY: Glucose-Capillary: 150 mg/dL — ABNORMAL HIGH (ref 70–99)

## 2019-03-23 LAB — C-REACTIVE PROTEIN: CRP: 11.1 mg/dL — ABNORMAL HIGH (ref <1.0)

## 2019-03-23 LAB — D-DIMER, QUANTITATIVE: D-Dimer, Quant: 0.75 ug/mL-FEU — ABNORMAL HIGH (ref 0.00–0.50)

## 2019-03-23 MED ORDER — DEXAMETHASONE 6 MG PO TABS: 6.0000 mg | ORAL_TABLET | Freq: Every day | ORAL | 0 refills | Status: AC

## 2019-03-23 NOTE — Progress Notes (Signed)
Pt given discharge instructions and discharge instructions explained to pt. Pt discharged home with family. Pt will contact PCP. IVs removed and sites covered with guaze and tape. Pt has no complaints at this time.

## 2019-03-23 NOTE — Discharge Summary (Addendum)
Physician Discharge Summary  John Porter LYY:[id number] DOB: 1985/07/09 DOA: 03/21/2019  PCP: Biagio Borg, MD  Admit date: 03/21/2019 Discharge date: 03/23/2019  Admitted From: Home Disposition: Home   Recommendations for Outpatient Follow-up:  1. Follow up with PCP in 1-2 weeks 2. Please obtain CMP/CBC in one week 3. Follow up for prediabetes.   Home Health: None Equipment/Devices: None Discharge Condition: Stable CODE STATUS: Full Diet recommendation: Carb-modified  Brief/Interim Summary: GAR GLANCE is a 34 y.o. male with a history of prediabetes who presented to [facility] ED 2/4 with about a week of myalgias, cough, decreased appetite, and progressive shortness of breath. He had been prescribed azithromycin and prednisone after a virtual visit on 1/30 but continued to feel worse prompting repeat e-visit and subsequent office visit in respiratory clinic 2/2 where augmentin was started while covid testing pending. He had multiple coworkers recently test positive for covid, though when evaluated 2/2 his test was negative. Pulse oximetry in the clinic was fair, though he was given return precautions including the recommendation to present urgently if pulse oximetry at home went low. When he noted home pulse oximetry reading in 80%'s he presented to [facility]. On presentation he had borderline oxygen saturations, 90% on room air, was tachycardic and hypertensive. SARS-CoV-2 PCR was positive, CXR showed bibasilar opacities and subsequent CTA chest revealed bilateral diffuse GGOs felt to be consistent with covid pneumonia without evidence of PE or lobar consolidation. WBC elevated at 17.6k so ceftriaxone and azithromycin initially given in the setting of code sepsis which was cancelled once covid testing was positive. Admission to [facility] was requested, remdesivir and solumedrol started. The patient was quickly weaned to room air, displaying no hypoxemia even with exertion on 2/5. This improvement continued and  the patient has remained stable for discharge on 2/6 with plans to complete remdesivir as an outpatient.  Discharge Diagnoses:  Principal Problem:   Acute hypoxemic respiratory failure due to severe acute respiratory syndrome coronavirus 2 (SARS-CoV-2) disease (HCC) Active Problems:   Hyperlipidemia   Hyperglycemia   Obesity, Class III, BMI 40-49.9 (morbid obesity) ([place])  Acute hypoxemic respiratory failure due to covid-19 pneumonia: SARS-CoV-2 PCR positive on 2/4. Flu negative. - Continue remdesivir x5 days (2/4 - 2/8). Arranged for outpatient infusion clinic follow up 2/7 and 2/8 at 11:30am prior to discharge.  - Steroids started with improvement in inflammatory markers and quick resolution of exertional hypoxemia. Will complete course with 2 more doses of decadron after discharge. - Tylenol and antitussives prn - Continue airborne, contact precautions for 21 days from positive testing. - PCT 0.2, leukocytosis likely due to steroids, no lobar consolidation, has already received a total of a full course of antibiotics for CAP including 2 additional doses of ceftriaxone and azithromycin while inpatient. Will not continue antibiotics at discharge. - Strongly encourage incentive spirometry with bibasilar atelectasis. Continue OOB often.  AKI: SCr 1.39 in ED, improved with IVF, SCr 0.98 on day of discharge. Due primarily to dehydration, possibly an element of ATN since UA with granular casts. Though in the setting of poor po intake, noting ketonuria without glucosuria consistent with fasting state, ketosis explains elevated anion gap. - Received 30cc/kg normal saline in ED. - Avoid nephrotoxins.  Prediabetes: HbA1c 5.8%, CBGs well controlled without significant insulin required.  - Continue to monitor per PCP.  Morbid obesity: BMI 43.5. Noted.  - Dietitian consulted, will supplement protein during catabolic illness.  Discharge Instructions Discharge Instructions    Diet - low sodium  heart  healthy   Complete by: As directed    Diet Carb Modified   Complete by: As directed    Discharge instructions   Complete by: As directed    You are being discharged from the hospital after treatment for covid-19 infection. You are felt to be stable enough to no longer require inpatient monitoring, testing, and treatment, though you will need to follow the recommendations below: - Continue taking decadron for 2 more days (start tomorrow) - Follow up here in the infusion center (drive to this hospital at [place] and speak with any security guard who can direct you to park and we will come out to get you in your car). for 2 more daily doses of remdesivir starting tomorrow - No further antibiotics are needed - Per CDC guidelines, you will need to remain in isolation for 21 days from your first positive covid test. - Do not take NSAID medications (including, but not limited to, ibuprofen, advil, motrin, naproxen, aleve, goody's powder, etc.) - Follow up with your doctor in the next week via telehealth or seek medical attention right away if your symptoms get WORSE.  - Consider donating plasma after you have recovered (either 14 days after a negative test or 28 days after symptoms have completely resolved) because your antibodies to this virus may be helpful to give to others with life-threatening infections. Please go to the website [email or web address] if you would like to consider volunteering for plasma donation.    Directions for you at home:  Wear a facemask You should wear a facemask that covers your nose and mouth when you are in the same room with other people and when you visit a healthcare provider. People who live with or visit you should also wear a facemask while they are in the same room with you.  Separate yourself from other people in your home As much as possible, you should stay in a different room from other people in your home. Also, you should use a  separate bathroom, if available.  Avoid sharing household items You should not share dishes, drinking glasses, cups, eating utensils, towels, bedding, or other items with other people in your home. After using these items, you should wash them thoroughly with soap and water.  Cover your coughs and sneezes Cover your mouth and nose with a tissue when you cough or sneeze, or you can cough or sneeze into your sleeve. Throw used tissues in a lined trash can, and immediately wash your hands with soap and water for at least 20 seconds or use an alcohol-based hand rub.  Wash your [facility] your hands often and thoroughly with soap and water for at least 20 seconds. You can use an alcohol-based hand sanitizer if soap and water are not available and if your hands are not visibly dirty. Avoid touching your eyes, nose, and mouth with unwashed hands.  Directions for those who live with, or provide care at home for you:  Limit the number of people who have contact with the patient If possible, have only one caregiver for the patient. Other household members should stay in another home or place of residence. If this is not possible, they should stay in another room, or be separated from the patient as much as possible. Use a separate bathroom, if available. Restrict visitors who do not have an essential need to be in the home.  Ensure good ventilation Make sure that shared spaces in the home have good air flow,  such as from an air conditioner or an opened window, weather permitting.  Wash your hands often Wash your hands often and thoroughly with soap and water for at least 20 seconds. You can use an alcohol based hand sanitizer if soap and water are not available and if your hands are not visibly dirty. Avoid touching your eyes, nose, and mouth with unwashed hands. Use disposable paper towels to dry your hands. If not available, use dedicated cloth towels and replace them when they become  wet.  Wear a facemask and gloves Wear a disposable facemask at all times in the room and gloves when you touch or have contact with the patient's blood, body fluids, and/or secretions or excretions, such as sweat, saliva, sputum, nasal mucus, vomit, urine, or feces.  Ensure the mask fits over your nose and mouth tightly, and do not touch it during use. Throw out disposable facemasks and gloves after using them. Do not reuse. Wash your hands immediately after removing your facemask and gloves. If your personal clothing becomes contaminated, carefully remove clothing and launder. Wash your hands after handling contaminated clothing. Place all used disposable facemasks, gloves, and other waste in a lined container before disposing them with other household waste. Remove gloves and wash your hands immediately after handling these items.  Do not share dishes, glasses, or other household items with the patient Avoid sharing household items. You should not share dishes, drinking glasses, cups, eating utensils, towels, bedding, or other items with a patient who is confirmed to have, or being evaluated for, COVID-19 infection. After the person uses these items, you should wash them thoroughly with soap and water.  Wash laundry thoroughly Immediately remove and wash clothes or bedding that have blood, body fluids, and/or secretions or excretions, such as sweat, saliva, sputum, nasal mucus, vomit, urine, or feces, on them. Wear gloves when handling laundry from the patient. Read and follow directions on labels of laundry or clothing items and detergent. In general, wash and dry with the warmest temperatures recommended on the label.  Clean all areas the individual has used often Clean all touchable surfaces, such as counters, tabletops, doorknobs, bathroom fixtures, toilets, phones, keyboards, tablets, and bedside tables, every day. Also, clean any surfaces that may have blood, body fluids, and/or  secretions or excretions on them. Wear gloves when cleaning surfaces the patient has come in contact with. Use a diluted bleach solution (e.g., dilute bleach with 1 part bleach and 10 parts water) or a household disinfectant with a label that says EPA-registered for coronaviruses. To make a bleach solution at home, add 1 tablespoon of bleach to 1 quart (4 cups) of water. For a larger supply, add  cup of bleach to 1 gallon (16 cups) of water. Read labels of cleaning products and follow recommendations provided on product labels. Labels contain instructions for safe and effective use of the cleaning product including precautions you should take when applying the product, such as wearing gloves or eye protection and making sure you have good ventilation during use of the product. Remove gloves and wash hands immediately after cleaning.  Monitor yourself for signs and symptoms of illness Caregivers and household members are considered close contacts, should monitor their health, and will be asked to limit movement outside of the home to the extent possible. Follow the monitoring steps for close contacts listed on the symptom monitoring form.  If you have additional questions, contact your local health department or call the epidemiologist on call at [phone] (available  24/7). This guidance is subject to change. For the most up-to-date guidance from [facility], please refer to their website: YouBlogs.pl   Increase activity slowly   Complete by: As directed      Allergies as of 03/23/2019   No Known Allergies     Medication List    STOP taking these medications   amoxicillin-clavulanate 875-125 MG tablet Commonly known as: AUGMENTIN   azithromycin 250 MG tablet Commonly known as: ZITHROMAX   predniSONE 10 MG (21) Tbpk tablet Commonly known as: STERAPRED UNI-PAK 21 TAB   Vitamin D (Ergocalciferol) 1.25 MG (50000 UNIT) Caps  capsule Commonly known as: DRISDOL     TAKE these medications   albuterol 108 (90 Base) MCG/ACT inhaler Commonly known as: VENTOLIN HFA Inhale 2 puffs into the lungs every 4 (four) hours as needed for wheezing or shortness of breath.   cyanocobalamin 1000 MCG/ML injection Commonly known as: (VITAMIN B-12) Take 1 ml IM monthly   dexamethasone 6 MG tablet Commonly known as: Decadron Take 1 tablet (6 mg total) by mouth daily for 2 days. Start taking on: March 24, 2019   EPINEPHrine 0.3 mg/0.3 mL Soaj injection Commonly known as: EPI-PEN Inject 0.3 mLs (0.3 mg total) into the muscle as needed for anaphylaxis.   HYDROcodone-homatropine 5-1.5 MG/5ML syrup Commonly known as: HYCODAN Take 5 mLs by mouth every 12 (twelve) hours as needed for cough (avoid driving while taking medication).      Follow-up Information    Biagio Borg, MD. Schedule an appointment as soon as possible for a visit.   Specialties: Internal Medicine, Radiology Contact information: [place] [address] [phone]          No Known Allergies  Consultations:  None  Procedures/Studies: DG Chest 2 View  Result Date: 03/19/2019 CLINICAL DATA:  Cough. EXAM: CHEST - 2 VIEW COMPARISON:  March 21, 2017 FINDINGS: Mildly decreased lung volumes are seen which is likely secondary to the degree of patient inspiration. Subsequent vascular crowding is noted. Very mild atelectasis is suspected within the bilateral lung bases. There is no evidence of a pleural effusion or pneumothorax. The heart size and mediastinal contours are within normal limits. The visualized skeletal structures are unremarkable. IMPRESSION: 1. Suboptimal patient inspiration with decreased lung volumes and mild bibasilar atelectasis. Electronically Signed   By: Virgina Norfolk M.D.   On: 03/19/2019 17:08   CT Angio Chest PE W and/or Wo Contrast  Result Date: 03/21/2019 CLINICAL DATA:  Shortness of breath EXAM: CT  ANGIOGRAPHY CHEST WITH CONTRAST TECHNIQUE: Multidetector CT imaging of the chest was performed using the standard protocol during bolus administration of intravenous contrast. Multiplanar CT image reconstructions and MIPs were obtained to evaluate the vascular anatomy. CONTRAST:  166m OMNIPAQUE IOHEXOL 350 MG/ML SOLN COMPARISON:  None. FINDINGS: Cardiovascular: Satisfactory opacification of the pulmonary arteries to the segmental level. No evidence of pulmonary embolism. Normal heart size. No pericardial effusion. Mediastinum/Nodes: No enlarged mediastinal, hilar, or axillary lymph nodes. Thyroid gland, trachea, and esophagus demonstrate no significant findings. Lungs/Pleura: Bilateral patchy ground-glass opacities throughout the lungs. No pleural effusion or pneumothorax. Upper Abdomen: No acute abnormality. Musculoskeletal: No chest wall abnormality. No acute or significant osseous findings. Review of the MIP images confirms the above findings. IMPRESSION: 1. No evidence of pulmonary embolus. 2. Bilateral patchy ground-glass opacities throughout the lungs as can be seen with multilobar pneumonia including atypical viral pneumonia. Electronically Signed   By: HKathreen Devoid  On: 03/21/2019 12:13   DG  Chest Portable 1 View  Result Date: 03/21/2019 CLINICAL DATA:  Cough, short of breath for several weeks EXAM: PORTABLE CHEST 1 VIEW COMPARISON:  03/19/2019 FINDINGS: Single frontal view of the chest demonstrates a stable cardiac silhouette. Lung volumes are diminished. Continued crowding of the central pulmonary vasculature with likely bibasilar atelectasis. No effusion or pneumothorax. IMPRESSION: 1. Low lung volumes with bibasilar atelectasis.  Stable exam. Electronically Signed   By: Randa Ngo M.D.   On: 03/21/2019 11:05    Subjective: Feels well, wants to go home. Walked yesterday without hypoxia, walked throughout unit. No chest pain. Didn't sleep well. No fevers. Eating ok.   Discharge  Exam: Vitals:   03/23/19 0749 03/23/19 0800  BP:    Pulse: 87 86  Resp: (!) 26 (!) 27  Temp:    SpO2: 96% 95%   General: Well-appearing male in no distress Cardiovascular: Regular w/rate ~90bpm, no murmur, gallop, or JVD Respiratory: Nonlabored and clear with rate 17/min at time of evaluation. Abdominal: Soft, NT, ND, bowel sounds + Extremities: No edema, no cyanosis  Labs: Basic Metabolic Panel: Recent Labs  Lab 03/21/19 1038 03/22/19 0121 03/23/19 0247  NA 133* 138 137  K 3.8 4.6 4.1  CL 97* 108 101  CO2 23 20* 21*  GLUCOSE 123* 129* 141*  BUN 15 13 21*  CREATININE 1.39* 1.09 0.98  CALCIUM 8.9 8.4* 8.4*   Liver Function Tests: Recent Labs  Lab 03/21/19 1038 03/22/19 0121 03/23/19 0247  AST 36 31 27  ALT _0 ALKPHOS 36* 47 49  BILITOT 1.0 0.3 0.5  PROT 8.4* 7.3 7.3  ALBUMIN 3.6 3.0* 2.9*   CBC: Recent Labs  Lab 03/21/19 1038 03/22/19 0121 03/23/19 0247  WBC 17.6* 12.7* 14.5*  NEUTROABS 14.8* 11.2* 12.3*  HGB 13.8 11.3* 12.8*  HCT 43.3 35.6* 40.1  MCV 73.4* 73.9* 72.6*  PLT 205 188 235   CBG: Recent Labs  Lab 03/21/19 1032 03/22/19 1641 03/23/19 0717  GLUCAP 121* 141* 150*   D-Dimer Recent Labs    03/22/19 0121 03/23/19 0247  DDIMER 0.99* 0.75*   Hgb A1c Recent Labs    03/23/19 0247  HGBA1C 5.8*   Lipid Profile Recent Labs    03/21/19 1315  TRIG 137   Anemia work up Recent Labs    03/21/19 1315 03/22/19 0121  FERRITIN 859* 985*   Urinalysis    Component Value Date/Time   COLORURINE YELLOW 03/21/2019 1018   APPEARANCEUR HAZY (A) 03/21/2019 1018   LABSPEC >1.030 (H) 03/21/2019 1018   PHURINE 6.0 03/21/2019 1018   GLUCOSEU NEGATIVE 03/21/2019 [place] 09/12/2018 1054   HGBUR MODERATE (A) 03/21/2019 1018   BILIRUBINUR SMALL (A) 03/21/2019 1018   BILIRUBINUR neg 12/28/2013 1102   KETONESUR 15 (A) 03/21/2019 1018   PROTEINUR >300 (A) 03/21/2019 1018   UROBILINOGEN 1.0 09/12/2018 1054   NITRITE  NEGATIVE 03/21/2019 1018   LEUKOCYTESUR NEGATIVE 03/21/2019 1018    Microbiology Recent Results (from the past 240 hour(s))  Novel Coronavirus, NAA ([facility])     Status: None   Collection Time: 03/19/19  5:57 PM   Specimen: Nasopharyngeal(NP) swabs in vial transport medium   NASOPHARYNGE  IS THIS  Result Value Ref Range Status   SARS-CoV-2, NAA Not Detected Not Detected Final    Comment: This nucleic acid amplification test was developed and its performance characteristics determined by [employer]. Nucleic acid amplification tests include RT-PCR and TMA. This test has not been FDA cleared  or approved. This test has been authorized by FDA under an Emergency Use Authorization (EUA). This test is only authorized for the duration of time the declaration that circumstances exist justifying the authorization of the emergency use of in vitro diagnostic tests for detection of SARS-CoV-2 virus and/or diagnosis of COVID-19 infection under section 564(b)(1) of the Act, 21 U.S.C. 716RCV-8(L) (1), unless the authorization is terminated or revoked sooner. When diagnostic testing is negative, the possibility of a false negative result should be considered in the context of a patient's recent exposures and the presence of clinical signs and symptoms consistent with COVID-19. An individual without symptoms of COVID-19 and who is not shedding SARS-CoV-2 virus wo uld expect to have a negative (not detected) result in this assay.   Urine culture     Status: None   Collection Time: 03/21/19 10:18 AM   Specimen: In/Out Cath Urine  Result Value Ref Range Status   Specimen Description   Final    IN/OUT CATH URINE Performed at [facility], [address] [place], [place] [address]    Special Requests   Final    NONE Performed at [facility], [address] [place], [place] [address]    Culture   Final    NO GROWTH Performed at [facility], [place]  [address] [place], [place] [address]    Report Status 03/22/2019 FINAL  Final  Blood Culture (routine x 2)     Status: None (Preliminary result)   Collection Time: 03/21/19 12:00 PM   Specimen: Right Antecubital; Blood  Result Value Ref Range Status   Specimen Description   Final    RIGHT ANTECUBITAL Performed at [facility], [address] [place], [place] [address]    Special Requests   Final    BOTTLES DRAWN AEROBIC AND ANAEROBIC Blood Culture adequate volume Performed at [facility], [address] [place], [place] [address]    Culture   Final    NO GROWTH 2 DAYS Performed at [facility], [place] [address] [place], [place] [address]    Report Status PENDING  Incomplete  Blood Culture (routine x 2)     Status: None (Preliminary result)   Collection Time: 03/21/19 12:15 PM   Specimen: Left Antecubital; Blood  Result Value Ref Range Status   Specimen Description   Final    LEFT ANTECUBITAL Performed at [facility], [address] [place], [place] [address]    Special Requests   Final    BOTTLES DRAWN AEROBIC AND ANAEROBIC Blood Culture adequate volume Performed at [facility], [address] [place], [place] [address]    Culture   Final    NO GROWTH 2 DAYS Performed at [facility], [place] [address], [place], [place] [address]    Report Status PENDING  Incomplete  Respiratory Panel by RT PCR (Flu A&B, Covid) - Nasopharyngeal Swab     Status: Abnormal   Collection Time: 03/21/19  1:15 PM   Specimen: Nasopharyngeal Swab  Result Value Ref Range Status   SARS Coronavirus 2 by RT PCR POSITIVE (A) NEGATIVE Final    Comment: RESULT CALLED TO, READ BACK BY AND VERIFIED WITH: RBeverley Fiedler RN 16:25 03/21/19 ([email or web address]) (NOTE) SARS-CoV-2 target nucleic acids are DETECTED. SARS-CoV-2 RNA is generally detectable in upper respiratory specimens  during the acute phase of infection. Positive results are indicative of the  presence of the  identified virus, but do not rule out bacterial infection or co-infection with other pathogens not detected by the test. Clinical correlation with patient history and other diagnostic information is necessary to determine patient infection status. The expected result is Negative. Fact Sheet for Patients:  PinkCheek.be Fact Sheet for Healthcare Providers: GravelBags.it This test is not yet approved or cleared by the [place] FDA and  has been authorized for detection and/or diagnosis of SARS-CoV-2 by FDA under an Emergency Use Authorization (EUA).  This EUA will remain in effect (meaning this test can be used) f or the duration of  the COVID-19 declaration under Section 564(b)(1) of the Act, 21 U.S.C. section 360bbb-3(b)(1), unless the authorization is terminated or revoked sooner.    Influenza A by PCR NEGATIVE NEGATIVE Final   Influenza B by PCR NEGATIVE NEGATIVE Final    Comment: (NOTE) The Xpert Xpress SARS-CoV-2/FLU/RSV assay is intended as an aid in  the diagnosis of influenza from Nasopharyngeal swab specimens and  should not be used as a sole basis for treatment. Nasal washings and  aspirates are unacceptable for Xpert Xpress SARS-CoV-2/FLU/RSV  testing. Fact Sheet for Patients: PinkCheek.be Fact Sheet for Healthcare Providers: GravelBags.it This test is not yet approved or cleared by the [place] FDA and  has been authorized for detection and/or diagnosis of SARS-CoV-2 by  FDA under an Emergency Use Authorization (EUA). This EUA will remain  in effect (meaning this test can be used) for the duration of the  Covid-19 declaration under Section 564(b)(1) of the Act, 21  U.S.C. section 360bbb-3(b)(1), unless the authorization is  terminated or revoked. Performed at [facility], [place] [address], [place], [place] [address]   SARS Coronavirus 2 by  RT PCR (hospital order, performed in [facility] hospital lab) Nasopharyngeal Nasal Swab (BD Veritor Kit)     Status: Abnormal   Collection Time: 03/21/19  4:13 PM   Specimen: Nasal Swab (BD Veritor Kit); Nasopharyngeal  Result Value Ref Range Status   SARS Coronavirus 2 POSITIVE (A) NEGATIVE Final    Comment: RESULT CALLED TO, READ BACK BY AND VERIFIED WITH:  MARCUM RUTH _0  ON 03/21/2019, [email or web address] Performed at [facility], [address] [place], [place] [address]     Time coordinating discharge: Approximately 40 minutes  Patrecia Pour, MD  [facility] 03/23/2019, 9:22 AM

## 2019-03-23 NOTE — Plan of Care (Signed)
  Problem: Clinical Measurements: Goal: Diagnostic test results will improve Outcome: Progressing Goal: Respiratory complications will improve Outcome: Progressing Goal: Cardiovascular complication will be avoided Outcome: Progressing   Problem: Activity: Goal: Risk for activity intolerance will decrease Outcome: Progressing   Problem: Nutrition: Goal: Adequate nutrition will be maintained Outcome: Progressing   Problem: Safety: Goal: Ability to remain free from injury will improve Outcome: Progressing   Problem: Education: Goal: Knowledge of risk factors and measures for prevention of condition will improve Outcome: Progressing   Problem: Coping: Goal: Psychosocial and spiritual needs will be supported Outcome: Progressing   Problem: Respiratory: Goal: Will maintain a patent airway Outcome: Progressing Goal: Complications related to the disease process, condition or treatment will be avoided or minimized Outcome: Progressing

## 2019-03-23 NOTE — Plan of Care (Signed)
Pt slept well during the night. No complaints of pain verbalized. Alert and oriented. Vitals stable on RA, remains tachypneic but improved from yesterday. Independent with ADLs.  Unable to tolerate prone positioning  overnight. No other issues, will monitor.   Problem: Coping: Goal: Psychosocial and spiritual needs will be supported Outcome: Progressing   Problem: Education: Goal: Knowledge of risk factors and measures for prevention of condition will improve Outcome: Progressing   Problem: Respiratory: Goal: Will maintain a patent airway Outcome: Progressing Goal: Complications related to the disease process, condition or treatment will be avoided or minimized Outcome: Progressing

## 2019-03-23 NOTE — Discharge Instructions (Addendum)
You are scheduled for an outpatient infusion of Remdesivir at 11:30 AM on Sunday 2/7 and Monday 2/8.  Please report to [facility] at [address]  Drive to the security guard and tell them you are here for an infusion. They will direct you to the front entrance where we will come and get you.  For questions call [phone].  Thanks

## 2019-03-23 NOTE — Progress Notes (Signed)
Patient scheduled for outpatient Remdesivir infusion at 11:30 AM on Sunday 2/7 and Monday 2/8.  Please advise them to report to [facility] at [address].  Drive to the security guard and tell them you are here for an infusion. They will direct you to the front entrance where we will come and get you.  For questions call [phone].  Thanks

## 2019-03-24 ENCOUNTER — Encounter (HOSPITAL_COMMUNITY): Payer: Self-pay

## 2019-03-24 ENCOUNTER — Ambulatory Visit (HOSPITAL_COMMUNITY)
Admission: RE | Admit: 2019-03-24 | Discharge: 2019-03-24 | Disposition: A | Discharge status: Home / Self Care | Payer: 59 | Source: Ambulatory Visit | Attending: Pulmonary Disease | Admitting: Pulmonary Disease

## 2019-03-24 DIAGNOSIS — U071 COVID-19: Secondary | ICD-10-CM | POA: Diagnosis present

## 2019-03-24 DIAGNOSIS — J1289 Other viral pneumonia: Secondary | ICD-10-CM | POA: Diagnosis not present

## 2019-03-24 MED ORDER — SODIUM CHLORIDE 0.9 % IV SOLN: 100.0000 mg | Freq: Once | INTRAVENOUS | Status: DC

## 2019-03-24 MED ORDER — METHYLPREDNISOLONE SODIUM SUCC 125 MG IJ SOLR: 125.0000 mg | Freq: Once | INTRAMUSCULAR | Status: DC | PRN

## 2019-03-24 MED ORDER — SODIUM CHLORIDE 0.9 % IV SOLN: INTRAVENOUS | Status: DC | PRN

## 2019-03-24 MED ORDER — EPINEPHRINE 0.3 MG/0.3ML IJ SOAJ: 0.3000 mg | Freq: Once | INTRAMUSCULAR | Status: DC | PRN

## 2019-03-24 MED ORDER — DIPHENHYDRAMINE HCL 50 MG/ML IJ SOLN: 50.0000 mg | Freq: Once | INTRAMUSCULAR | Status: DC | PRN

## 2019-03-24 MED ORDER — FAMOTIDINE IN NACL 20-0.9 MG/50ML-% IV SOLN: 20.0000 mg | Freq: Once | INTRAVENOUS | Status: DC | PRN

## 2019-03-24 MED ORDER — ALBUTEROL SULFATE HFA 108 (90 BASE) MCG/ACT IN AERS: 2.0000 | INHALATION_SPRAY | Freq: Once | RESPIRATORY_TRACT | Status: DC | PRN

## 2019-03-24 MED ORDER — SODIUM CHLORIDE 0.9 % IV SOLN
100.0000 mg | Freq: Once | INTRAVENOUS | Status: AC
  Administered 2019-03-24: 12:00:00 100 mg via INTRAVENOUS
  Filled 2019-03-24: qty 20

## 2019-03-24 NOTE — Progress Notes (Signed)
  Diagnosis: COVID-19  Physician: DR. Delford Field  Procedure: [facility]fusion [facility] Med: remdesivir infusion.  Complications: No immediate complications noted.  Discharge: Discharged home   Essie Hart 03/24/2019

## 2019-03-24 NOTE — Discharge Instructions (Signed)
10 Things You Can Do to Manage Your COVID-19 Symptoms at Home If you have possible or confirmed COVID-19: 1. Stay home from work and school. And stay away from other public places. If you must go out, avoid using any kind of public transportation, ridesharing, or taxis. 2. Monitor your symptoms carefully. If your symptoms get worse, call your healthcare provider immediately. 3. Get rest and stay hydrated. 4. If you have a medical appointment, call the healthcare provider ahead of time and tell them that you have or may have COVID-19. 5. For medical emergencies, call 911 and notify the dispatch personnel that you have or may have COVID-19. 6. Cover your cough and sneezes with a tissue or use the inside of your elbow. 7. Wash your hands often with soap and water for at least 20 seconds or clean your hands with an alcohol-based hand sanitizer that contains at least 60% alcohol. 8. As much as possible, stay in a specific room and away from other people in your home. Also, you should use a separate bathroom, if available. If you need to be around other people in or outside of the home, wear a mask. 9. Avoid sharing personal items with other people in your household, like dishes, towels, and bedding. 10. Clean all surfaces that are touched often, like counters, tabletops, and doorknobs. Use household cleaning sprays or wipes according to the label instructions. cdc.gov/coronavirus 08/15/2018 This information is not intended to replace advice given to you by your health care provider. Make sure you discuss any questions you have with your health care provider. Document Revised: 01/17/2019 Document Reviewed: 01/17/2019 Elsevier Patient Education  2020 Elsevier Inc.  

## 2019-03-25 ENCOUNTER — Ambulatory Visit (HOSPITAL_COMMUNITY)
Admit: 2019-03-25 | Discharge: 2019-03-25 | Disposition: A | Discharge status: Home / Self Care | Payer: 59 | Attending: Pulmonary Disease | Admitting: Pulmonary Disease

## 2019-03-25 ENCOUNTER — Telehealth: Payer: Self-pay | Admitting: *Deleted

## 2019-03-25 ENCOUNTER — Ambulatory Visit: Payer: 59

## 2019-03-25 DIAGNOSIS — U071 COVID-19: Secondary | ICD-10-CM | POA: Diagnosis not present

## 2019-03-25 LAB — GLUCOSE, CAPILLARY: Glucose-Capillary: 131 mg/dL — ABNORMAL HIGH (ref 70–99)

## 2019-03-25 MED ORDER — ALBUTEROL SULFATE HFA 108 (90 BASE) MCG/ACT IN AERS: 2.0000 | INHALATION_SPRAY | Freq: Once | RESPIRATORY_TRACT | Status: DC | PRN

## 2019-03-25 MED ORDER — DIPHENHYDRAMINE HCL 50 MG/ML IJ SOLN: 50.0000 mg | Freq: Once | INTRAMUSCULAR | Status: DC | PRN

## 2019-03-25 MED ORDER — SODIUM CHLORIDE 0.9 % IV SOLN
INTRAVENOUS | Status: DC | PRN
  Administered 2019-03-25: 250 mL via INTRAVENOUS

## 2019-03-25 MED ORDER — SODIUM CHLORIDE 0.9 % IV SOLN
100.0000 mg | Freq: Once | INTRAVENOUS | Status: AC
  Administered 2019-03-25: 100 mg via INTRAVENOUS
  Filled 2019-03-25: qty 20

## 2019-03-25 MED ORDER — METHYLPREDNISOLONE SODIUM SUCC 125 MG IJ SOLR: 125.0000 mg | Freq: Once | INTRAMUSCULAR | Status: DC | PRN

## 2019-03-25 MED ORDER — FAMOTIDINE IN NACL 20-0.9 MG/50ML-% IV SOLN: 20.0000 mg | Freq: Once | INTRAVENOUS | Status: DC | PRN

## 2019-03-25 MED ORDER — EPINEPHRINE 0.3 MG/0.3ML IJ SOAJ: 0.3000 mg | Freq: Once | INTRAMUSCULAR | Status: DC | PRN

## 2019-03-25 NOTE — Progress Notes (Signed)
  Diagnosis: COVID-19  Physician: Dr. Wright  Procedure: [facility] Infusion Clinic Med: remdesivir infusion.  Complications: No immediate complications noted.  Discharge: Discharged home   Adrienne Trombetta 03/25/2019  

## 2019-03-25 NOTE — Telephone Encounter (Signed)
Called pt to verify appt that's been made for tomorrow 03/26/19. Pt is aware of appt he states he made this am. Completed TCM questionnaire below.Raechel Chute   Transition Care Management Follow-up Telephone Call   Date discharged? 03/23/19   How have you been since you were released from the hospital? Pt states he is ok    Do you understand why you were in the hospital? YES   Do you understand the discharge instructions? YES   Where were you discharged to? HOME   Items Reviewed:  Medications reviewed: YES, no longer taking the antibiotic amoxillan, zpac, nor using prednisone. Pt states they gave him 2 days of decadron. Will take last one tomorrow   Allergies reviewed: YES  Dietary changes reviewed: YES, Heart healthy  Referrals reviewed: No referral recommeded   Functional Questionnaire:   Activities of Daily Living (ADLs):   He states he are independent in the following: ambulation, bathing and hygiene, feeding, continence, grooming, toileting and dressing States he doesn't require assistance    Any transportation issues/concerns?: NO   Any patient concerns? NO   Confirmed importance and date/time of follow-up visits scheduled YES, (virtual) 03/26/19  Provider Appointment booked with Dr. Jonny Ruiz  Confirmed with patient if condition begins to worsen call PCP or go to the ER.  Patient was given the office number and encouraged to call back with question or concerns.  : YES

## 2019-03-26 ENCOUNTER — Ambulatory Visit (INDEPENDENT_AMBULATORY_CARE_PROVIDER_SITE_OTHER): Payer: 59 | Admitting: Internal Medicine

## 2019-03-26 ENCOUNTER — Encounter: Payer: Self-pay | Admitting: Internal Medicine

## 2019-03-26 DIAGNOSIS — J309 Allergic rhinitis, unspecified: Secondary | ICD-10-CM | POA: Diagnosis not present

## 2019-03-26 DIAGNOSIS — U071 COVID-19: Secondary | ICD-10-CM | POA: Diagnosis not present

## 2019-03-26 DIAGNOSIS — R739 Hyperglycemia, unspecified: Secondary | ICD-10-CM | POA: Diagnosis not present

## 2019-03-26 DIAGNOSIS — J9601 Acute respiratory failure with hypoxia: Secondary | ICD-10-CM

## 2019-03-26 LAB — CULTURE, BLOOD (ROUTINE X 2)
Culture: NO GROWTH
Culture: NO GROWTH
Special Requests: ADEQUATE
Special Requests: ADEQUATE

## 2019-03-26 NOTE — Patient Instructions (Signed)
Please continue all other medications as before, and refills have been done if requested.  Please have the pharmacy call with any other refills you may need.  Please keep your appointments with your specialists as you may have planned  You are given the work note

## 2019-03-26 NOTE — Assessment & Plan Note (Signed)
stable overall by history and exam, recent data reviewed with pt, and pt to continue medical treatment as before,  to f/u any worsening symptoms or concerns  

## 2019-03-26 NOTE — Progress Notes (Signed)
Patient ID: John Porter, male   DOB: [birth year], 34 y.o.   MRN: [id number]  Virtual Visit via Video Note  I connected with John Porter on 03/26/19 at  1:20 PM EST by a video enabled telemedicine application and verified that I am speaking with the correct person using two identifiers.  Location: Patient: at home Provider:  at office  I discussed the limitations of evaluation and management by telemedicine and the availability of in person appointments. The patient expressed understanding and agreed to proceed.  History of Present Illness: Her to f/u after recent covid infection, not sure when symptoms started b/c also had strep throat prior, but may have started about jan 20, and hospd feb 4 - feb 6 with acute resp failure covid + after home oximetry with decreased sats < 90.  Tx also with azithro and rocephin IV with leukocytosis and able to wean rapidly off o2. Was able for d/c feb 6 with plan to complete remdesivir. Pt states needs work note, has ongoing exhaustion.  Pt denies chest pain, increased sob or doe, wheezing, orthopnea, PND, increased LE swelling, palpitations, dizziness or syncope.   Past Medical History:  Diagnosis Date  . Hyperlipidemia 06/01/2011  . Obesity    No past surgical history on file.  reports that he has never smoked. He has never used smokeless tobacco. He reports that he does not drink alcohol or use drugs. family history includes Asthma in his father; Hyperlipidemia in his father; Hypertension in his father and mother. No Known Allergies Current Outpatient Medications on File Prior to Visit  Medication Sig Dispense Refill  . albuterol (VENTOLIN HFA) 108 (90 Base) MCG/ACT inhaler Inhale 2 puffs into the lungs every 4 (four) hours as needed for wheezing or shortness of breath.    . cyanocobalamin (,VITAMIN B-12,) 1000 MCG/ML injection Take 1 ml IM monthly 3 mL 3  . dexamethasone (DECADRON) 6 MG tablet Take 1 tablet (6 mg total) by mouth daily for 2 days. 2  tablet 0  . EPINEPHrine 0.3 mg/0.3 mL IJ SOAJ injection Inject 0.3 mLs (0.3 mg total) into the muscle as needed for anaphylaxis. 1 each 1  . HYDROcodone-homatropine (HYCODAN) 5-1.5 MG/5ML syrup Take 5 mLs by mouth every 12 (twelve) hours as needed for cough (avoid driving while taking medication). 100 mL 0   No current facility-administered medications on file prior to visit.    Observations/Objective: Alert, NAD, appropriate mood and affect, resps normal, cn 2-12 intact, moves all 4s, no visible rash or swelling Lab Results  Component Value Date   WBC 14.5 (H) 03/23/2019   HGB 12.8 (L) 03/23/2019   HCT 40.1 03/23/2019   PLT 235 03/23/2019   GLUCOSE 141 (H) 03/23/2019   CHOL 190 09/12/2018   TRIG 137 03/21/2019   HDL 34.10 (L) 09/12/2018   LDLDIRECT 133.0 09/12/2018   LDLCALC 129 (H) 03/02/2017   ALT 31 03/23/2019   AST 27 03/23/2019   NA 137 03/23/2019   K 4.1 03/23/2019   CL 101 03/23/2019   CREATININE 0.98 03/23/2019   BUN 21 (H) 03/23/2019   CO2 21 (L) 03/23/2019   TSH 1.60 09/12/2018   INR 1.2 03/21/2019   HGBA1C 5.8 (H) 03/23/2019   Assessment and Plan: See notes  Follow Up Instructions: See notes   I discussed the assessment and treatment plan with the patient. The patient was provided an opportunity to ask questions and all were answered. The patient agreed with the plan and demonstrated an understanding  of the instructions.   The patient was advised to call back or seek an in-person evaluation if the symptoms worsen or if the condition fails to improve as anticipated.  John Cower, MD

## 2019-03-26 NOTE — Assessment & Plan Note (Signed)
Resolved,  to f/u any worsening symptoms or concerns, work note to be given

## 2019-05-19 ENCOUNTER — Encounter: Payer: Self-pay | Admitting: Internal Medicine

## 2019-08-07 ENCOUNTER — Other Ambulatory Visit: Payer: Self-pay

## 2019-08-07 ENCOUNTER — Ambulatory Visit (INDEPENDENT_AMBULATORY_CARE_PROVIDER_SITE_OTHER): Payer: 59 | Admitting: Medical

## 2019-08-07 VITALS — BP 136/84 | HR 91 | Temp 96.9°F | Resp 18 | Ht 68.0 in | Wt 299.6 lb

## 2019-08-07 DIAGNOSIS — H6690 Otitis media, unspecified, unspecified ear: Secondary | ICD-10-CM | POA: Diagnosis not present

## 2019-08-07 DIAGNOSIS — J309 Allergic rhinitis, unspecified: Secondary | ICD-10-CM | POA: Diagnosis not present

## 2019-08-07 MED ORDER — AMOXICILLIN-POT CLAVULANATE 875-125 MG PO TABS: 1.0000 | ORAL_TABLET | Freq: Two times a day (BID) | ORAL | 0 refills | Status: DC

## 2019-08-07 MED ORDER — FLUTICASONE PROPIONATE 50 MCG/ACT NA SUSP: 2.0000 | Freq: Every day | NASAL | 1 refills | Status: DC

## 2019-08-07 MED FILL — AMOX-CLAV 875-125 MG TABLET: 875-125 | 10 days supply | Qty: 20 | Fill #0

## 2019-08-07 MED FILL — FLUTICASONE PROP 50 MCG SPR: 50 | 30 days supply | Qty: 16 | Fill #0

## 2019-08-07 NOTE — Progress Notes (Signed)
   Subjective:    Patient ID: John Porter, male    DOB: 01/17/1986, 34 y.o.   MRN: [id number]  HPI  Pt in with left ear feels like can't hear. Mild pop sensation at time. No fever, no chills, no sweats and no ear pain. Mild nasal congestion and runnny nose for past 2 weeks.  Some cough intermittent that is yellow.no chest congestion. No sinus pressure.   Pt has no chronic hx of OM.  Review of Systems  Constitutional: Negative for chills, fatigue and fever.  HENT: Positive for rhinorrhea. Negative for congestion, ear pain, postnasal drip, sinus pressure and sinus pain.   Respiratory: Positive for cough. Negative for shortness of breath and wheezing.        Rare cough.  Cardiovascular: Negative for chest pain and palpitations.  Musculoskeletal: Negative for back pain.  Hematological: Negative for adenopathy. Does not bruise/bleed easily.  Psychiatric/Behavioral: Negative for behavioral problems and confusion.       Objective:   Physical Exam  General  Mental Status - Alert. General Appearance - Well groomed. Not in acute distress.  Skin Rashes- No Rashes.  HEENT Head- Normal. Ear Auditory Canal - Left- bright red tm Right - Normal.Tympanic Membrane- Left- Normal. Right- Normal. Eye Sclera/Conjunctiva- Left- Normal. Right- Normal. Nose & Sinuses Nasal Mucosa- Left-  Boggy and Congested. Right-  Boggy and  Congested.Bilateral maxillary and frontal sinus pressure. Mouth & Throat Lips: Upper Lip- Normal: no dryness, cracking, pallor, cyanosis, or vesicular eruption. Lower Lip-Normal: no dryness, cracking, pallor, cyanosis or vesicular eruption. Buccal Mucosa- Bilateral- No Aphthous ulcers. Oropharynx- No Discharge or Erythema. Tonsils: Characteristics- Bilateral- No Erythema or Congestion. Size/Enlargement- Bilateral- No enlargement. Discharge- bilateral-None.  Neck Neck- Supple. No Masses.   Chest and Lung Exam Auscultation: Breath Sounds:-Clear even and  unlabored.  Cardiovascular Auscultation:Rythm- Regular, rate and rhythm. Murmurs & Other Heart Sounds:Ausculatation of the heart reveal- No Murmurs.  Lymphatic Head & Neck General Head & Neck Lymphatics: Bilateral: Description- No Localized lymphadenopathy.       Assessment & Plan:  Recent allergic rhinitis signs/symptoms that preceded left ear infection.  Will prescribe flonase nasal spray and augmentin antibiotic.  Rare cough. If worsens let me know and will rx benzonatate.  Follow up 10-14 days or as needed  [employer]

## 2019-08-07 NOTE — Patient Instructions (Signed)
Recent allergic rhinitis signs/symptoms that preceded left ear infection.  Will prescribe flonase nasal spray and augmentin antibiotic.  Rare cough. If worsens let me know and will rx benzonatate.  Follow up 10-14 days or as needed

## 2019-08-08 ENCOUNTER — Ambulatory Visit: Payer: 59 | Admitting: Internal Medicine

## 2019-08-08 DIAGNOSIS — Z0289 Encounter for other administrative examinations: Secondary | ICD-10-CM

## 2019-10-17 ENCOUNTER — Other Ambulatory Visit: Payer: Self-pay

## 2019-10-17 ENCOUNTER — Emergency Department
Admission: EM | Admit: 2019-10-17 | Discharge: 2019-10-17 | Disposition: A | Discharge status: Home / Self Care | Payer: 59 | Source: Home / Self Care

## 2019-10-17 DIAGNOSIS — J029 Acute pharyngitis, unspecified: Secondary | ICD-10-CM | POA: Diagnosis not present

## 2019-10-17 DIAGNOSIS — Z20818 Contact with and (suspected) exposure to other bacterial communicable diseases: Secondary | ICD-10-CM

## 2019-10-17 DIAGNOSIS — J020 Streptococcal pharyngitis: Secondary | ICD-10-CM | POA: Diagnosis not present

## 2019-10-17 DIAGNOSIS — R0981 Nasal congestion: Secondary | ICD-10-CM

## 2019-10-17 LAB — POCT RAPID STREP A (OFFICE): Rapid Strep A Screen: POSITIVE — AB

## 2019-10-17 MED ORDER — AMOXICILLIN 500 MG PO CAPS: 500.0000 mg | ORAL_CAPSULE | Freq: Three times a day (TID) | ORAL | 0 refills | Status: AC

## 2019-10-17 NOTE — Discharge Instructions (Signed)

## 2019-10-17 NOTE — ED Triage Notes (Signed)
Patient presents to Urgent Care with complaints of sore throat since two days ago. Patient reports his daughter just had strep and he thinks he caught it from her.

## 2019-10-17 NOTE — ED Provider Notes (Signed)
[name]    CSN: [id number] Arrival date & time: 10/17/19  1147      History   Chief Complaint Chief Complaint  Patient presents with  . Sore Throat    HPI John Porter is a 34 y.o. male.   HPI  John Porter is a 34 y.o. male presenting to UC with c/o sore throat, body aches, chills, mild congestion that started 2 days ago. His 3yo daughter was dx with strep throat on Monday of this week. Pt denies fever. No n/v/d. He had both doses of Moderna COVID vaccine in March/April 2021.  No other known sick contacts.    Past Medical History:  Diagnosis Date  . Hyperlipidemia 06/01/2011  . Obesity     Patient Active Problem List   Diagnosis Date Noted  . Acute hypoxemic respiratory failure due to COVID-19 (HCC) 03/21/2019  . Acute hypoxemic respiratory failure due to severe acute respiratory syndrome coronavirus 2 (SARS-CoV-2) disease (HCC) 03/21/2019  . Obesity, Class III, BMI 40-49.9 (morbid obesity) (HCC) 03/21/2019  . Allergic rhinitis 06/04/2018  . Flu-like symptoms 12/29/2016  . Hyperglycemia 03/11/2016  . Possible exposure to STD 01/14/2014  . Hyperlipidemia 06/01/2011  . Genital herpes 05/17/2011  . Preventative health care 05/12/2011    History reviewed. No pertinent surgical history.     Home Medications    Prior to Admission medications   Medication Sig Start Date End Date Taking? Authorizing Provider  albuterol (VENTOLIN HFA) 108 (90 Base) MCG/ACT inhaler Inhale 2 puffs into the lungs every 4 (four) hours as needed for wheezing or shortness of breath. Patient not taking: Reported on 08/07/2019    [provider]  amoxicillin (AMOXIL) 500 MG capsule Take 1 capsule (500 mg total) by mouth 3 (three) times daily for 10 days. 10/17/19 10/27/19  Lurene Shadow, PA-C  cyanocobalamin (,VITAMIN B-12,) 1000 MCG/ML injection Take 1 ml IM monthly Patient not taking: Reported on 08/07/2019 10/01/18   Corwin Levins, MD  EPINEPHrine 0.3 mg/0.3 mL IJ SOAJ  injection Inject 0.3 mLs (0.3 mg total) into the muscle as needed for anaphylaxis. 12/18/18   Sharlene Dory, DO  fluticasone (FLONASE) 50 MCG/ACT nasal spray Place 2 sprays into both nostrils daily. 08/07/19   Saguier, Ramon Dredge, PA-C  HYDROcodone-homatropine (HYCODAN) 5-1.5 MG/5ML syrup Take 5 mLs by mouth every 12 (twelve) hours as needed for cough (avoid driving while taking medication). 03/19/19   Bing Neighbors, FNP    Family History Family History  Problem Relation Age of Onset  . Hypertension Mother   . Hypertension Father   . Hyperlipidemia Father   . Asthma Father     Social History Social History   Tobacco Use  . Smoking status: Never Smoker  . Smokeless tobacco: Never Used  Substance Use Topics  . Alcohol use: No    Comment: rare  . Drug use: No     Allergies   Patient has no known allergies.   Review of Systems Review of Systems  Constitutional: Positive for chills. Negative for fever.  HENT: Positive for congestion and sore throat. Negative for ear pain, trouble swallowing and voice change.   Respiratory: Negative for cough and shortness of breath.   Cardiovascular: Negative for chest pain and palpitations.  Gastrointestinal: Negative for abdominal pain, diarrhea, nausea and vomiting.  Musculoskeletal: Positive for arthralgias, back pain and myalgias.  Skin: Negative for rash.  All other systems reviewed and are negative.    Physical Exam Triage Vital Signs  ED Triage Vitals  Enc Vitals Group     BP 10/17/19 1202 (!) 144/87     Pulse Rate 10/17/19 1202 91     Resp 10/17/19 1202 16     Temp 10/17/19 1202 98.5 F (36.9 C)     Temp Source 10/17/19 1202 Oral     SpO2 10/17/19 1202 98 %     Weight --      Height --      Head Circumference --      Peak Flow --      Pain Score 10/17/19 1200 6     Pain Loc --      Pain Edu? --      Excl. in GC? --    No data found.  Updated Vital Signs BP (!) 144/87 (BP Location: Left Arm)   Pulse 91    Temp 98.5 F (36.9 C) (Oral)   Resp 16   SpO2 98%   Visual Acuity Right Eye Distance:   Left Eye Distance:   Bilateral Distance:    Right Eye Near:   Left Eye Near:    Bilateral Near:     Physical Exam Vitals and nursing note reviewed.  Constitutional:      General: He is not in acute distress.    Appearance: He is well-developed. He is not ill-appearing, toxic-appearing or diaphoretic.  HENT:     Head: Normocephalic and atraumatic.     Right Ear: Tympanic membrane and ear canal normal.     Left Ear: Tympanic membrane and ear canal normal.     Nose: Nose normal.     Right Sinus: No maxillary sinus tenderness or frontal sinus tenderness.     Left Sinus: No maxillary sinus tenderness or frontal sinus tenderness.     Mouth/Throat:     Lips: Pink.     Mouth: Mucous membranes are moist.     Pharynx: Oropharynx is clear. Uvula midline. Posterior oropharyngeal erythema present. No pharyngeal swelling, oropharyngeal exudate or uvula swelling.     Tonsils: No tonsillar exudate or tonsillar abscesses.  Cardiovascular:     Rate and Rhythm: Normal rate and regular rhythm.  Pulmonary:     Effort: Pulmonary effort is normal. No respiratory distress.     Breath sounds: Normal breath sounds. No stridor. No wheezing, rhonchi or rales.  Musculoskeletal:        General: Normal range of motion.     Cervical back: Normal range of motion and neck supple.  Lymphadenopathy:     Cervical: Cervical adenopathy present.  Skin:    General: Skin is warm and dry.  Neurological:     Mental Status: He is alert and oriented to person, place, and time.  Psychiatric:        Behavior: Behavior normal.      UC Treatments / Results  Labs (all labs ordered are listed, but only abnormal results are displayed) Labs Reviewed  POCT RAPID STREP A (OFFICE) - Abnormal; Notable for the following components:      Result Value   Rapid Strep A Screen Positive (*)    All other components within normal limits    SARS-COV-2 RNA,(COVID-19) QUALITATIVE NAAT    EKG   Radiology No results found.  Procedures Procedures (including critical care time)  Medications Ordered in UC Medications - No data to display  Initial Impression / Assessment and Plan / UC Course  I have reviewed the triage vital signs and the nursing notes.  Pertinent labs &  imaging results that were available during my care of the patient were reviewed by me and considered in my medical decision making (see chart for details).     Strep: POSITIVE COVID test: pending Rx: amoxicillin F/u with PCP as needed AVS given  Final Clinical Impressions(s) / UC Diagnoses   Final diagnoses:  Sore throat  Nasal congestion  Exposure to strep throat  Streptococcal sore throat     Discharge Instructions      Please take antibiotics as prescribed and be sure to complete entire course even if you start to feel better to ensure infection does not come back.  You may take 500mg  acetaminophen every 4-6 hours or in combination with ibuprofen 400-600mg  every 6-8 hours as needed for pain, inflammation, and fever.  Be sure to well hydrated with clear liquids and get at least 8 hours of sleep at night, preferably more while sick.   Please follow up with family medicine in 1 week if needed.     ED Prescriptions    Medication Sig Dispense Auth. Provider   amoxicillin (AMOXIL) 500 MG capsule Take 1 capsule (500 mg total) by mouth 3 (three) times daily for 10 days. 30 capsule , Lurene Shadow     PDMP not reviewed this encounter.   [place], Lurene Shadow 10/17/19 1232

## 2019-10-18 LAB — SARS-COV-2 RNA,(COVID-19) QUALITATIVE NAAT: SARS CoV2 RNA: NOT DETECTED

## 2020-02-29 IMAGING — CT CT ANGIO CHEST
3 of 9 series · 18 of 36 positions shown · IV contrast (Omnipaque)
Comparison: None.

CLINICAL DATA: Shortness of breath

EXAM:
CT ANGIOGRAPHY CHEST WITH CONTRAST
TECHNIQUE: Multidetector CT imaging of the chest was performed using the
standard protocol during bolus administration of intravenous
contrast. Multiplanar CT image reconstructions and MIPs were
obtained to evaluate the vascular anatomy.
CONTRAST:  100mL OMNIPAQUE IOHEXOL 350 MG/ML SOLN

[Series 5: pe lung · axial · 0.78mm/px · z∈[-190,-121]mm · 2 of 69 slices shown]
[im 23/69  mediastinal]
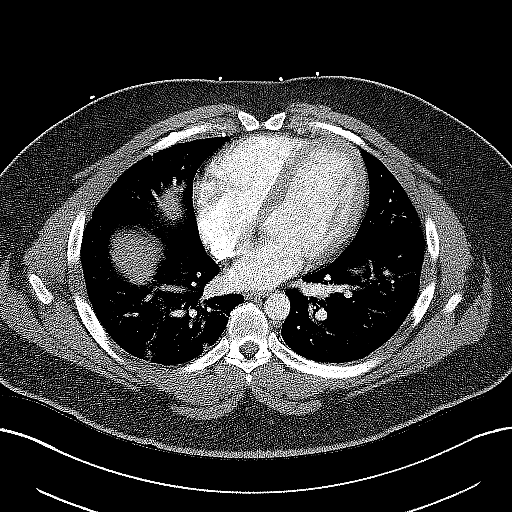
[im 46/69  mediastinal]
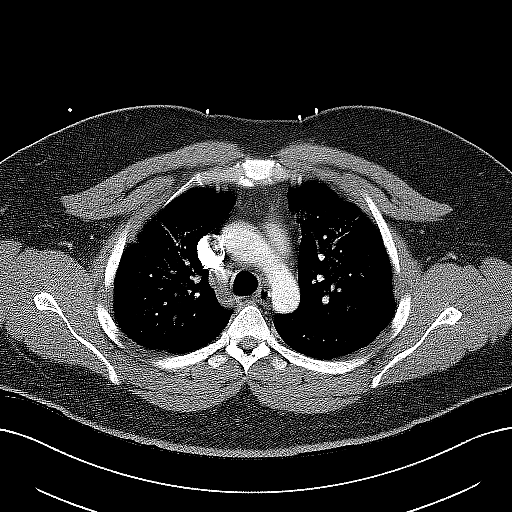

[Series 6: pe thins · axial · 0.79mm/px · z∈[-286,-68]mm · 15 of 250 slices shown]
[im 16/250  lung]
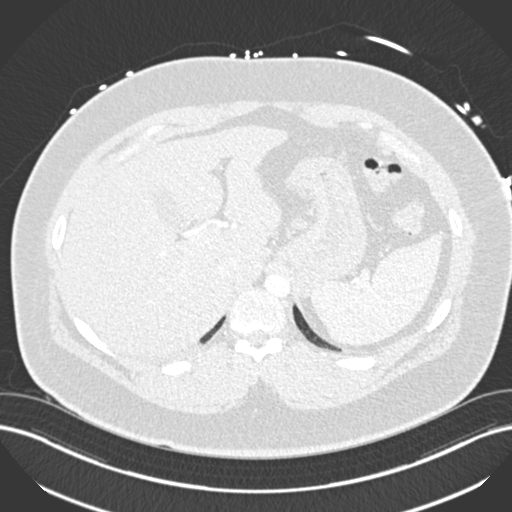
[im 32/250  mediastinal]
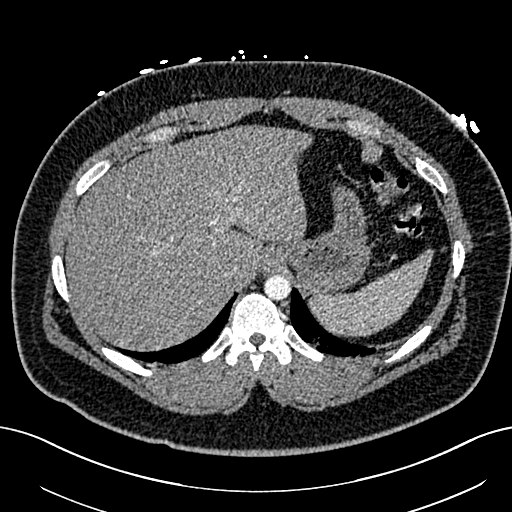
[im 47/250  lung]
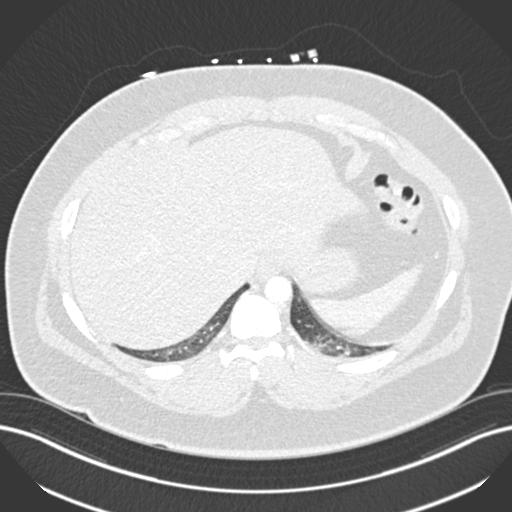
[im 63/250  mediastinal]
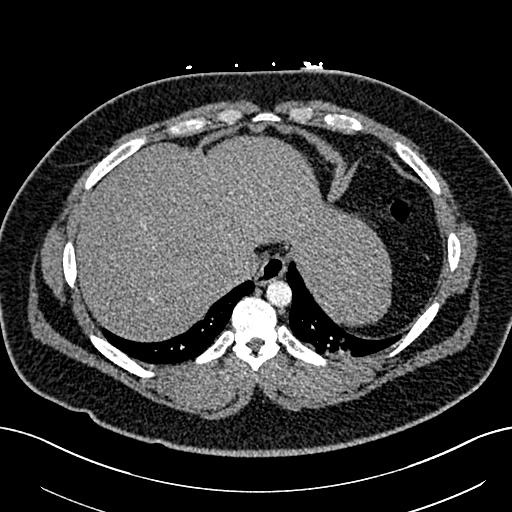
[im 78/250  lung]
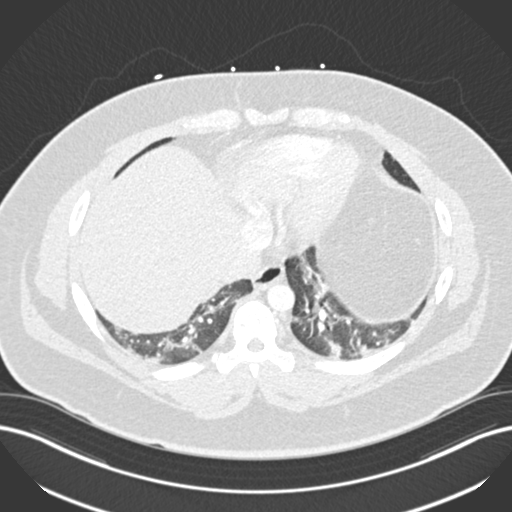
[im 94/250  mediastinal]
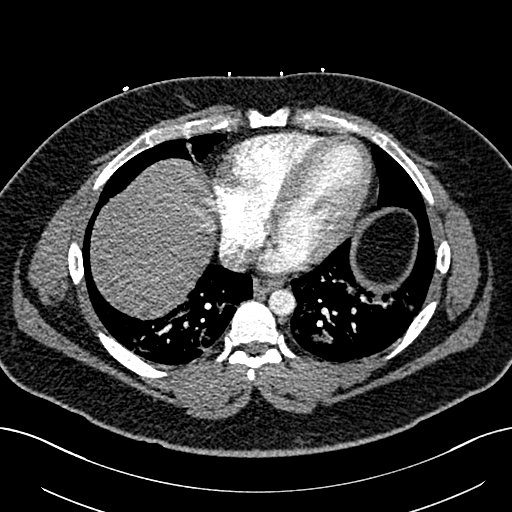
[im 109/250  lung]
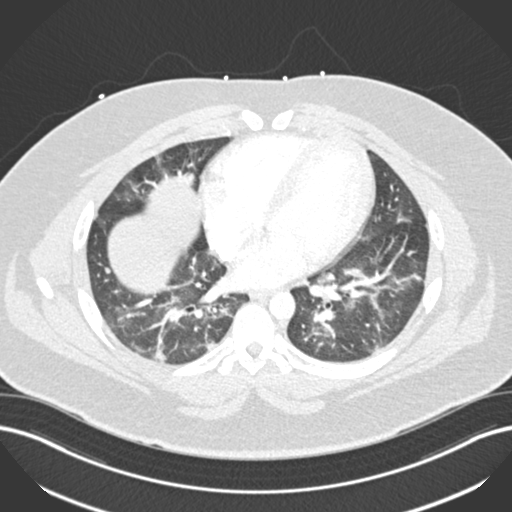
[im 125/250  mediastinal]
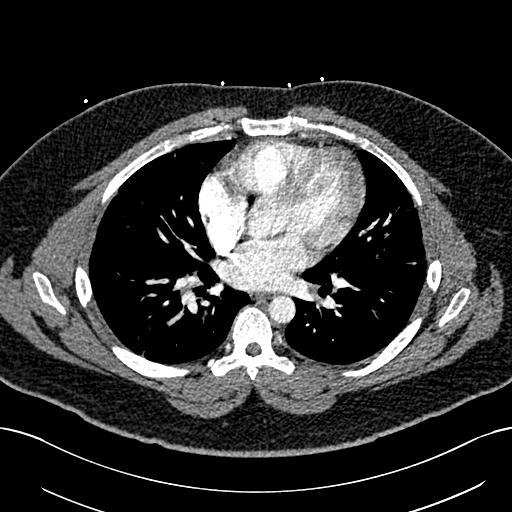
[im 141/250  lung]
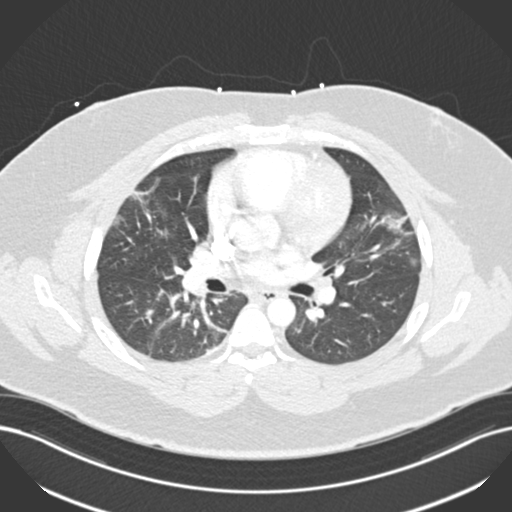
[im 156/250  mediastinal]
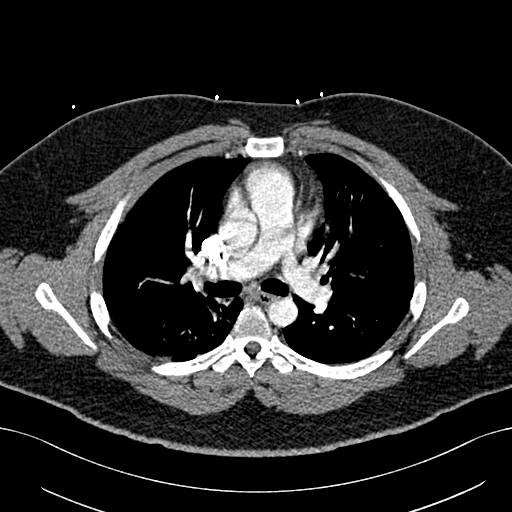
[im 172/250  lung]
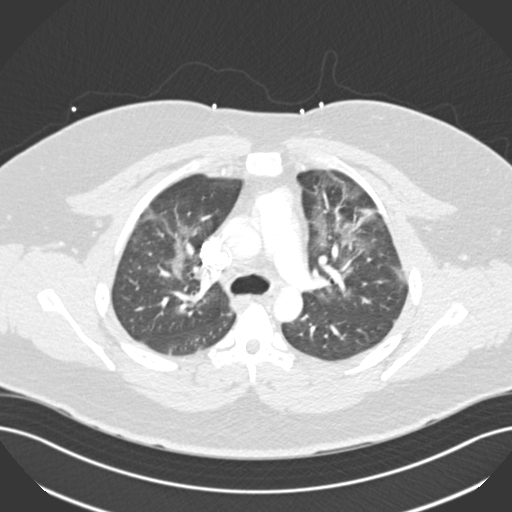
[im 187/250  mediastinal]
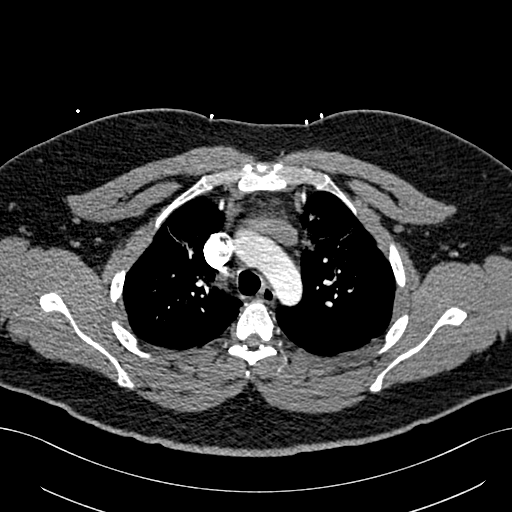
[im 203/250  lung]
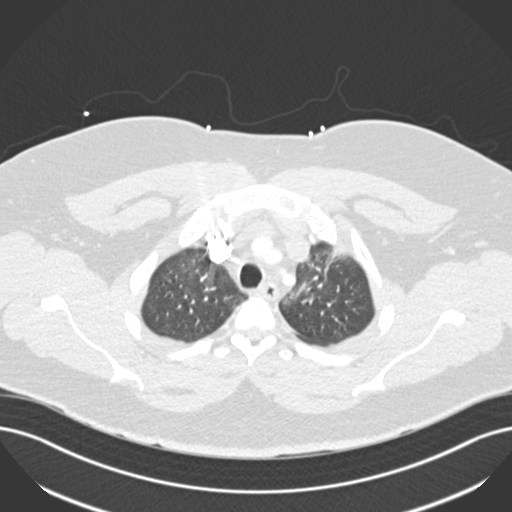
[im 218/250  mediastinal]
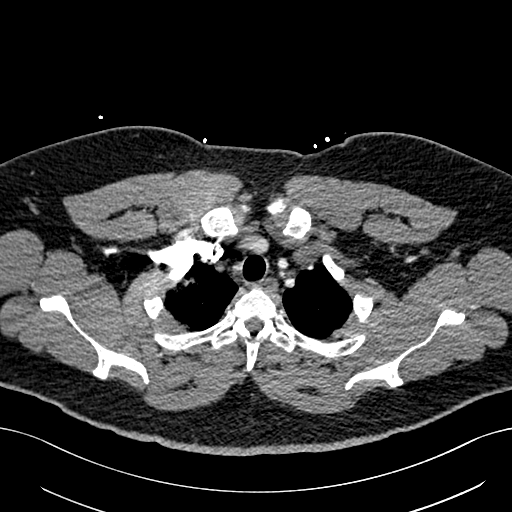
[im 234/250  lung]
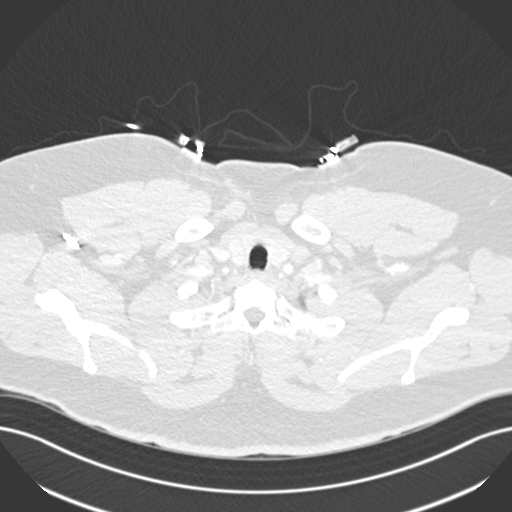

[Series 7: pe coronal mpr · coronal · 0.49mm/px · 1 of 145 slices shown]
[im 73/145  mediastinal]
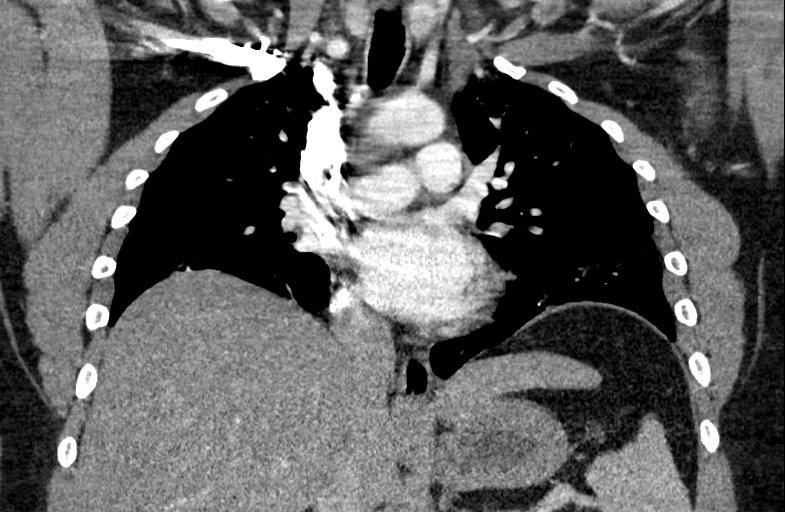

[18 of 36 positions shown; findings below may reference images not displayed]

FINDINGS: Cardiovascular: Satisfactory opacification of the pulmonary arteries
to the segmental level. No evidence of pulmonary embolism. Normal
heart size. No pericardial effusion.

Mediastinum/Nodes: No enlarged mediastinal, hilar, or axillary lymph
nodes. Thyroid gland, trachea, and esophagus demonstrate no
significant findings.

Lungs/Pleura: Bilateral patchy ground-glass opacities throughout the
lungs. No pleural effusion or pneumothorax.

Upper Abdomen: No acute abnormality.

Musculoskeletal: No chest wall abnormality. No acute or significant
osseous findings.

Review of the MIP images confirms the above findings.
IMPRESSION: 1. No evidence of pulmonary embolus.
2. Bilateral patchy ground-glass opacities throughout the lungs as
can be seen with multilobar pneumonia including atypical viral
pneumonia.

## 2020-04-10 ENCOUNTER — Encounter: Payer: 59 | Admitting: Internal Medicine

## 2020-04-10 DIAGNOSIS — Z0289 Encounter for other administrative examinations: Secondary | ICD-10-CM

## 2020-05-12 ENCOUNTER — Encounter: Payer: 59 | Admitting: Internal Medicine

## 2021-02-04 ENCOUNTER — Ambulatory Visit: Payer: 59 | Admitting: Internal Medicine

## 2021-02-04 ENCOUNTER — Other Ambulatory Visit (INDEPENDENT_AMBULATORY_CARE_PROVIDER_SITE_OTHER): Payer: 59

## 2021-02-04 ENCOUNTER — Other Ambulatory Visit: Payer: Self-pay

## 2021-02-04 ENCOUNTER — Encounter: Payer: Self-pay | Admitting: Internal Medicine

## 2021-02-04 VITALS — BP 128/70 | HR 85 | Ht 68.0 in | Wt 298.0 lb

## 2021-02-04 DIAGNOSIS — Z0000 Encounter for general adult medical examination without abnormal findings: Secondary | ICD-10-CM

## 2021-02-04 DIAGNOSIS — E7800 Pure hypercholesterolemia, unspecified: Secondary | ICD-10-CM | POA: Diagnosis not present

## 2021-02-04 DIAGNOSIS — R32 Unspecified urinary incontinence: Secondary | ICD-10-CM

## 2021-02-04 DIAGNOSIS — R739 Hyperglycemia, unspecified: Secondary | ICD-10-CM | POA: Diagnosis not present

## 2021-02-04 DIAGNOSIS — Z0001 Encounter for general adult medical examination with abnormal findings: Secondary | ICD-10-CM | POA: Diagnosis not present

## 2021-02-04 LAB — URINALYSIS, ROUTINE W REFLEX MICROSCOPIC
Bilirubin Urine: NEGATIVE
Hgb urine dipstick: NEGATIVE
Ketones, ur: NEGATIVE
Leukocytes,Ua: NEGATIVE
Nitrite: NEGATIVE
Specific Gravity, Urine: 1.02 (ref 1.000–1.030)
Total Protein, Urine: NEGATIVE
Urine Glucose: NEGATIVE
Urobilinogen, UA: 1 (ref 0.0–1.0)
pH: 7 (ref 5.0–8.0)

## 2021-02-04 LAB — CBC WITH DIFFERENTIAL/PLATELET
Basophils Absolute: 0 10*3/uL (ref 0.0–0.1)
Basophils Relative: 0.5 % (ref 0.0–3.0)
Eosinophils Absolute: 0.1 10*3/uL (ref 0.0–0.7)
Eosinophils Relative: 1.1 % (ref 0.0–5.0)
HCT: 41.3 % (ref 39.0–52.0)
Hemoglobin: 13 g/dL (ref 13.0–17.0)
Lymphocytes Relative: 37 % (ref 12.0–46.0)
Lymphs Abs: 2.4 10*3/uL (ref 0.7–4.0)
MCHC: 31.4 g/dL (ref 30.0–36.0)
MCV: 72.4 fl — ABNORMAL LOW (ref 78.0–100.0)
Monocytes Absolute: 0.4 10*3/uL (ref 0.1–1.0)
Monocytes Relative: 6.2 % (ref 3.0–12.0)
Neutro Abs: 3.6 10*3/uL (ref 1.4–7.7)
Neutrophils Relative %: 55.2 % (ref 43.0–77.0)
Platelets: 229 10*3/uL (ref 150.0–400.0)
RBC: 5.71 Mil/uL (ref 4.22–5.81)
RDW: 15.5 % (ref 11.5–15.5)
WBC: 6.5 10*3/uL (ref 4.0–10.5)

## 2021-02-04 LAB — LIPID PANEL
Cholesterol: 179 mg/dL (ref 0–200)
HDL: 33.3 mg/dL — ABNORMAL LOW (ref >39.00)
LDL Cholesterol: 112 mg/dL — ABNORMAL HIGH (ref 0–99)
NonHDL: 145.72
Total CHOL/HDL Ratio: 5
Triglycerides: 171 mg/dL — ABNORMAL HIGH (ref 0.0–149.0)
VLDL: 34.2 mg/dL (ref 0.0–40.0)

## 2021-02-04 LAB — BASIC METABOLIC PANEL
BUN: 14 mg/dL (ref 6–23)
CO2: 27 mEq/L (ref 19–32)
Calcium: 9.5 mg/dL (ref 8.4–10.5)
Chloride: 105 mEq/L (ref 96–112)
Creatinine, Ser: 1.15 mg/dL (ref 0.40–1.50)
GFR: 81.92 mL/min (ref >60.00)
Glucose, Bld: 98 mg/dL (ref 70–99)
Potassium: 3.7 mEq/L (ref 3.5–5.1)
Sodium: 138 mEq/L (ref 135–145)

## 2021-02-04 LAB — HEPATIC FUNCTION PANEL
ALT: 25 U/L (ref 0–53)
AST: 19 U/L (ref 0–37)
Albumin: 4.5 g/dL (ref 3.5–5.2)
Alkaline Phosphatase: 55 U/L (ref 39–117)
Bilirubin, Direct: 0.1 mg/dL (ref 0.0–0.3)
Total Bilirubin: 0.6 mg/dL (ref 0.2–1.2)
Total Protein: 7.5 g/dL (ref 6.0–8.3)

## 2021-02-04 LAB — POCT GLYCOSYLATED HEMOGLOBIN (HGB A1C): Hemoglobin A1C: 5.4 % (ref 4.0–5.6)

## 2021-02-04 NOTE — Patient Instructions (Signed)
Your A1c was 5.4 - normal  Please continue all other medications as before, and refills have been done if requested.  Please have the pharmacy call with any other refills you may need.  Please continue your efforts at being more active, low cholesterol diet, and weight control.  You are otherwise up to date with prevention measures today.  Please keep your appointments with your specialists as you may have planned  Please go to the LAB at the blood drawing area for the tests to be done  You will be contacted by phone if any changes need to be made immediately.  Otherwise, you will receive a letter about your results with an explanation, but please check with MyChart first.  Please remember to sign up for MyChart if you have not done so, as this will be important to you in the future with finding out test results, communicating by private email, and scheduling acute appointments online when needed.  Please make an Appointment to return for your 1 year visit, or sooner if needed

## 2021-02-04 NOTE — Progress Notes (Signed)
Patient ID: John Porter, male   DOB: 1985-07-31, 35 y.o.   MRN: [id number]         Chief Complaint:: wellness exam and Office Visit (Patient has had a couple of issues with urinating while sleep)  And hyperglycemia, hld       HPI:  John Porter is a 35 y.o. male here for wellness exam; due for flu shot, declines covid vax, o/w up to date                        Also Pt denies chest pain, increased sob or doe, wheezing, orthopnea, PND, increased LE swelling, palpitations, dizziness or syncope.   Pt denies polydipsia, polyuria, or but twice over 2 wks awoke with having urinated while sleeping; Denies urinary symptoms such as dysuria, frequency, urgency, flank pain, hematuria or n/v, fever, chills.   Pt denies fever, wt loss, night sweats, loss of appetite, or other constitutional symptoms  No other new complaints.  Denies hypersomnolence or non restorative sleep.  Denies increased po fluids.   Denies worsening depressive symptoms, suicidal ideation, or panic Wt Readings from Last 3 Encounters:  02/04/21 298 lb (135.2 kg)  08/07/19 299 lb 9.6 oz (135.9 kg)  03/21/19 286 lb 3.2 oz (129.8 kg)   BP Readings from Last 3 Encounters:  02/04/21 128/70  10/17/19 (!) 144/87  08/07/19 136/84   Immunization History  Administered Date(s) Administered   Influenza,inj,Quad PF,6+ Mos 12/25/2014, 11/16/2015   Influenza-Unspecified 12/07/2016, 11/07/2018   Td 02/14/2006   Tdap 03/02/2017   Health Maintenance Due  Topic Date Due   INFLUENZA VACCINE  09/14/2020      Past Medical History:  Diagnosis Date   Hyperlipidemia 06/01/2011   Obesity    History reviewed. No pertinent surgical history.  reports that he has never smoked. He has never used smokeless tobacco. He reports that he does not drink alcohol and does not use drugs. family history includes Asthma in his father; Hyperlipidemia in his father; Hypertension in his father and mother. No Known Allergies Current Outpatient Medications on File  Prior to Visit  Medication Sig Dispense Refill   fluticasone (FLONASE) 50 MCG/ACT nasal spray Place 2 sprays into both nostrils daily. 16 g 1   cyanocobalamin (,VITAMIN B-12,) 1000 MCG/ML injection Take 1 ml IM monthly (Patient not taking: Reported on 08/07/2019) 3 mL 3   EPINEPHrine 0.3 mg/0.3 mL IJ SOAJ injection Inject 0.3 mLs (0.3 mg total) into the muscle as needed for anaphylaxis. (Patient not taking: Reported on 02/04/2021) 1 each 1   No current facility-administered medications on file prior to visit.        ROS:  All others reviewed and negative.  Objective        PE:  BP 128/70 (BP Location: Right Arm, Patient Position: Sitting, Cuff Size: Large)    Pulse 85    Ht 5\' 8"  (1.727 m)    Wt 298 lb (135.2 kg)    SpO2 97%    BMI 45.31 kg/m                 Constitutional: Pt appears in NAD               HENT: Head: NCAT.                Right Ear: External ear normal.                 Left Ear: External ear  normal.                Eyes: . Pupils are equal, round, and reactive to light. Conjunctivae and EOM are normal               Nose: without d/c or deformity               Neck: Neck supple. Gross normal ROM               Cardiovascular: Normal rate and regular rhythm.                 Pulmonary/Chest: Effort normal and breath sounds without rales or wheezing.                Abd:  Soft, NT, ND, + BS, no organomegaly               Neurological: Pt is alert. At baseline orientation, motor grossly intact               Skin: Skin is warm. No rashes, no other new lesions, LE edema - none               Psychiatric: Pt behavior is normal without agitation   Micro: none  Cardiac tracings I have personally interpreted today:  none  Pertinent Radiological findings (summarize): none   Lab Results  Component Value Date   WBC 6.5 02/04/2021   HGB 13.0 02/04/2021   HCT 41.3 02/04/2021   PLT 229.0 02/04/2021   GLUCOSE 98 02/04/2021   CHOL 179 02/04/2021   TRIG 171.0 (H) 02/04/2021   HDL  33.30 (L) 02/04/2021   LDLDIRECT 133.0 09/12/2018   LDLCALC 112 (H) 02/04/2021   ALT 25 02/04/2021   AST 19 02/04/2021   NA 138 02/04/2021   K 3.7 02/04/2021   CL 105 02/04/2021   CREATININE 1.15 02/04/2021   BUN 14 02/04/2021   CO2 27 02/04/2021   TSH 1.10 02/04/2021   INR 1.2 03/21/2019   HGBA1C 5.4 02/04/2021   Hemoglobin A1C 4.0 - 5.6 % 5.4  5.8 High  R, CM  5.9 R, CM  5.9 R, CM  5.7 R, CM    Assessment/Plan:  John Porter is a 35 y.o. Black or African American [2] male with  has a past medical history of Hyperlipidemia (06/01/2011) and Obesity.  Encounter for well adult exam with abnormal findings Age and sex appropriate education and counseling updated with regular exercise and diet Referrals for preventative services - none needed Immunizations addressed - for flu shot, declines covid booster Smoking counseling  - none needed Evidence for depression or other mood disorder - none significant Most recent labs reviewed. I have personally reviewed and have noted: 1) the patient's medical and social history 2) The patient's current medications and supplements 3) The patient's height, weight, and BMI have been recorded in the chart   Hyperlipidemia Lab Results  Component Value Date   LDLCALC 112 (H) 02/04/2021   Mild uncontrolled, goal ldl < 100, pt to continue current low chol diet, declines statin  Hyperglycemia Lab Results  Component Value Date   HGBA1C 5.4 02/04/2021   Stable, pt to continue current medical treatment  - diet   Enuresis Etiology unclear, for UA with labs,  to f/u any worsening symptoms or concerns  Followup: Return in about 1 year (around 02/04/2022).  Oliver Barre, MD 02/05/2021 9:17 PM [facility] Internal  Medicine

## 2021-02-05 ENCOUNTER — Ambulatory Visit: Payer: 59 | Admitting: Internal Medicine

## 2021-02-05 ENCOUNTER — Encounter: Payer: Self-pay | Admitting: Internal Medicine

## 2021-02-05 DIAGNOSIS — R32 Unspecified urinary incontinence: Secondary | ICD-10-CM | POA: Insufficient documentation

## 2021-02-05 LAB — TSH: TSH: 1.1 u[IU]/mL (ref 0.35–5.50)

## 2021-02-05 NOTE — Assessment & Plan Note (Addendum)
Lab Results  Component Value Date   LDLCALC 112 (H) 02/04/2021   Mild uncontrolled, goal ldl < 100, pt to continue current low chol diet, declines statin

## 2021-02-05 NOTE — Assessment & Plan Note (Signed)
Etiology unclear, for UA with labs,  to f/u any worsening symptoms or concerns

## 2021-02-05 NOTE — Assessment & Plan Note (Signed)
Lab Results  Component Value Date   HGBA1C 5.4 02/04/2021   Stable, pt to continue current medical treatment  - diet

## 2021-02-05 NOTE — Assessment & Plan Note (Signed)
Age and sex appropriate education and counseling updated with regular exercise and diet Referrals for preventative services - none needed Immunizations addressed - for flu shot, declines covid booster Smoking counseling  - none needed Evidence for depression or other mood disorder - none significant Most recent labs reviewed. I have personally reviewed and have noted: 1) the patient's medical and social history 2) The patient's current medications and supplements 3) The patient's height, weight, and BMI have been recorded in the chart  

## 2021-02-13 ENCOUNTER — Telehealth: Payer: 59 | Admitting: Emergency Medicine

## 2021-02-13 DIAGNOSIS — J069 Acute upper respiratory infection, unspecified: Secondary | ICD-10-CM | POA: Diagnosis not present

## 2021-02-14 ENCOUNTER — Encounter: Payer: Self-pay | Admitting: Emergency Medicine

## 2021-02-14 MED ORDER — AZELASTINE HCL 0.1 % NA SOLN: 1.0000 | Freq: Two times a day (BID) | NASAL | 12 refills | Status: DC

## 2021-02-14 MED ORDER — BENZONATATE 100 MG PO CAPS: 100.0000 mg | ORAL_CAPSULE | Freq: Two times a day (BID) | ORAL | 0 refills | Status: DC | PRN

## 2021-02-14 NOTE — Progress Notes (Signed)

## 2021-02-14 NOTE — Progress Notes (Signed)
I have spent 5 minutes in review of e-visit questionnaire, review and updating patient chart, medical decision making and response to patient.   Sharlie Shreffler, PA-C    

## 2021-05-13 ENCOUNTER — Other Ambulatory Visit: Payer: Self-pay

## 2021-05-13 ENCOUNTER — Other Ambulatory Visit (HOSPITAL_BASED_OUTPATIENT_CLINIC_OR_DEPARTMENT_OTHER): Payer: Self-pay

## 2021-05-13 ENCOUNTER — Encounter (HOSPITAL_BASED_OUTPATIENT_CLINIC_OR_DEPARTMENT_OTHER): Payer: Self-pay | Admitting: Emergency Medicine

## 2021-05-13 ENCOUNTER — Emergency Department (HOSPITAL_BASED_OUTPATIENT_CLINIC_OR_DEPARTMENT_OTHER)
Admission: EM | Admit: 2021-05-13 | Discharge: 2021-05-13 | Disposition: A | Discharge status: Home / Self Care | Payer: 59 | Attending: Emergency Medicine | Admitting: Emergency Medicine

## 2021-05-13 DIAGNOSIS — A059 Bacterial foodborne intoxication, unspecified: Secondary | ICD-10-CM | POA: Diagnosis not present

## 2021-05-13 DIAGNOSIS — R000 Tachycardia, unspecified: Secondary | ICD-10-CM | POA: Diagnosis not present

## 2021-05-13 DIAGNOSIS — R112 Nausea with vomiting, unspecified: Secondary | ICD-10-CM | POA: Insufficient documentation

## 2021-05-13 DIAGNOSIS — R197 Diarrhea, unspecified: Secondary | ICD-10-CM | POA: Insufficient documentation

## 2021-05-13 DIAGNOSIS — R1030 Lower abdominal pain, unspecified: Secondary | ICD-10-CM | POA: Insufficient documentation

## 2021-05-13 MED ORDER — DICYCLOMINE HCL 10 MG PO CAPS
20.0000 mg | ORAL_CAPSULE | Freq: Once | ORAL | Status: AC
  Administered 2021-05-13: 20 mg via ORAL
  Filled 2021-05-13: qty 2

## 2021-05-13 MED ORDER — DICYCLOMINE HCL 20 MG PO TABS
20.0000 mg | ORAL_TABLET | Freq: Two times a day (BID) | ORAL | 0 refills | Status: DC | PRN
  Filled 2021-05-13: qty 20, 10d supply, fill #0

## 2021-05-13 MED ORDER — ONDANSETRON 4 MG PO TBDP
4.0000 mg | ORAL_TABLET | Freq: Three times a day (TID) | ORAL | 0 refills | Status: DC | PRN
  Filled 2021-05-13: qty 15, 5d supply, fill #0

## 2021-05-13 MED ORDER — ONDANSETRON 4 MG PO TBDP
4.0000 mg | ORAL_TABLET | Freq: Once | ORAL | Status: AC
  Administered 2021-05-13: 4 mg via ORAL
  Filled 2021-05-13: qty 1

## 2021-05-13 NOTE — ED Provider Notes (Signed)
?[place] EMERGENCY DEPARTMENT ?Provider Note ? ? ?CSN: [id number] ?Arrival date & time: 05/13/21  [id number] ? ?  ? ?History ? ?Chief Complaint  ?Patient presents with  ? Emesis  ? Diarrhea  ? ? ?John Porter is a 36 y.o. male presented to ED with nausea, vomiting and diarrhea.  The patient reports that he ate fried chicken from a gas station yesterday afternoon around 3 PM.  He said several hours later began having abdominal cramping, subsequently began feeling nauseated and has had diarrhea on and off all night into today.  He says they are watery, nonbloody bowel movements.  He has diffuse lower abdominal cramping with the diarrhea.  He also feels nauseated but is able to drink Gatorade and keep down fluids at home.  He denies any other significant medical problems, denies any history of abdominal surgery, denies any recent foreign travel, drinking unfiltered water or stream water, or history of C. difficile. ? ?HPI ? ?  ? ?Home Medications ?Prior to Admission medications   ?Medication Sig Start Date End Date Taking? Authorizing Provider  ?dicyclomine (BENTYL) 20 MG tablet Take 1 tablet (20 mg total) by mouth 2 (two) times daily as needed for up to 20 doses for spasms, abdominal cramping, and diarrhea 05/13/21  Yes Imonie Tuch, Carola Rhine, MD  ?ondansetron (ZOFRAN-ODT) 4 MG disintegrating tablet Take 1 tablet (4 mg total) by mouth every 8 (eight) hours as needed for nausea or vomiting. 05/13/21  Yes Nollan Muldrow, Carola Rhine, MD  ?azelastine (ASTELIN) 0.1 % nasal spray Place 1 spray into both nostrils 2 (two) times daily. Use in each nostril as directed 02/14/21   Wurst, [place], PA-C  ?benzonatate (TESSALON) 100 MG capsule Take 1 capsule (100 mg total) by mouth 2 (two) times daily as needed for cough. 02/14/21   Wurst, [place], PA-C  ?cyanocobalamin (,VITAMIN B-12,) 1000 MCG/ML injection Take 1 ml IM monthly ?Patient not taking: Reported on 08/07/2019 10/01/18   Biagio Borg, MD  ?EPINEPHrine 0.3 mg/0.3 mL IJ SOAJ injection  Inject 0.3 mLs (0.3 mg total) into the muscle as needed for anaphylaxis. ?Patient not taking: Reported on 02/04/2021 12/18/18   Shelda Pal, DO  ?fluticasone [facility]) 50 MCG/ACT nasal spray Place 2 sprays into both nostrils daily. 08/07/19   Saguier, Percell Miller, PA-C  ?   ? ?Allergies    ?Patient has no known allergies.   ? ?Review of Systems   ?Review of Systems ? ?Physical Exam ?Updated Vital Signs ?BP (!) 134/100   Pulse (!) 103   Temp 98.6 ?F (37 ?C) (Oral)   Resp 17   Ht 5\' 8"  (1.727 m)   Wt 131.5 kg   SpO2 96%   BMI 44.09 kg/m?  ?Physical Exam ?Constitutional:   ?   General: He is not in acute distress. ?HENT:  ?   Head: Normocephalic and atraumatic.  ?Eyes:  ?   Conjunctiva/sclera: Conjunctivae normal.  ?   Pupils: Pupils are equal, round, and reactive to light.  ?Cardiovascular:  ?   Rate and Rhythm: Regular rhythm. Tachycardia present.  ?Pulmonary:  ?   Effort: Pulmonary effort is normal. No respiratory distress.  ?Abdominal:  ?   General: There is no distension.  ?   Tenderness: There is no abdominal tenderness. There is no right CVA tenderness, guarding or rebound. Negative signs include Murphy's sign and McBurney's sign.  ?Skin: ?   General: Skin is warm and dry.  ?Neurological:  ?   General: No focal deficit present.  ?  Mental Status: He is alert. Mental status is at baseline.  ?Psychiatric:     ?   Mood and Affect: Mood normal.     ?   Behavior: Behavior normal.  ? ? ?ED Results / Procedures / Treatments   ?Labs ?(all labs ordered are listed, but only abnormal results are displayed) ?Labs Reviewed - No data to display ? ?EKG ?None ? ?Radiology ?No results found. ? ?Procedures ?Procedures  ? ? ?Medications Ordered in ED ?Medications  ?dicyclomine (BENTYL) capsule 20 mg (20 mg Oral Given 05/13/21 0759)  ?ondansetron (ZOFRAN-ODT) disintegrating tablet 4 mg (4 mg Oral Given 05/13/21 0759)  ? ? ?ED Course/ Medical Decision Making/ A&P ?  ?                        ?Medical Decision  Making ?Risk ?Prescription drug management. ? ? ?This patient presents to the Emergency Department with complaint of abdominal pain. This involves an extensive number of treatment options, and is a complaint that carries with it a high risk of complications and morbidity.  The differential diagnosis includes, but is not limited to, food-born illness most likely, given the nature of his symptoms and timing after eating gas station chicken, versus colitis versus viral illness versus other ? ?He has a benign abdominal exam, no focal findings to suggest acute appendicitis or acute biliary disease.  He has no rigidity or guarding. ? ?I discussed with the patient the options of starting an IV for fluids, however if he can keep himself orally hydrated I do think this would be a reasonable and cheaper option as well, given that he is otherwise a healthy 36 year old male.  I doubt significant clinical dehydration after a single day of diarrhea.  He thinks he can drink water and is not having any difficulties keeping orally hydrated.  I explained that most foodborne illnesses last 24-48 hours and then resolve, we can try to manage abdominal cramping with Bentyl and his nausea with Zofran, which he is amenable to. ? ?At this time, in an effort to keep down the cost of his workup and radiation exposure, I think is reasonable to forego blood tests or abdominal CT scan.  I did discuss return precautions with the patient, who verbalized understanding and agreement with the plan.  He is otherwise stable for discharge ? ?* ? ?After the interventions stated above, I reevaluated the patient and found that they remained clinically stable. ? ?Based on the patient's clinical exam, vital signs, risk factors, and ED testing, I felt that the patient's overall risk of life-threatening emergency such as bowel perforation, surgical emergency, or sepsis was quite low.  I suspect this clinical presentation is most consistent with food-born  illness, but explained to the patient that this evaluation was not a definitive diagnostic workup. ? ?I discussed outpatient follow up with primary care provider, and provided specialist office number on the patient's discharge paper if a referral was deemed necessary.  I discussed return precautions with the patient. I felt the patient was clinically stable for discharge. ? ? ? ? ? ? ? ? ?Final Clinical Impression(s) / ED Diagnoses ?Final diagnoses:  ?Food poisoning  ? ? ?Rx / DC Orders ?ED Discharge Orders   ? ?      Ordered  ?  dicyclomine (BENTYL) 20 MG tablet  2 times daily PRN       ? 05/13/21 0800  ?  ondansetron (ZOFRAN-ODT) 4 MG disintegrating tablet  Every 8 hours PRN       ? 05/13/21 0800  ? ?  ?  ? ?  ? ? ?  ?Wyvonnia Dusky, MD ?05/13/21 [phone] ? ?

## 2021-05-13 NOTE — Discharge Instructions (Addendum)
Your clinical history was most consistent with food poisoning, or an illness from what you ate yesterday.  Most of the time the symptoms will last 24 to 48 hours and then resolved.  We talked about keeping yourself hydrated at home by continuously drinking water (gatorade is also fine).  I prescribed you Bentyl for abdominal cramping and diarrhea, and Zofran for nausea as needed.  Please reach out to your primary care doctor's office to see if you can schedule follow-up appointment for Monday. ? ?In the meantime, you should return to the ER if you begin having persistent vomiting and cannot keep down any fluids, or if your diarrhea returns blood, or if you begin to feel weak, lightheaded, or like passing out.  You should also return to the ER if you begin having sharp or focal pain in the right upper side of your abdomen or the right lower side of your abdomen, which may be a sign of early gallbladder problems or appendicitis.  At this time in the ER you did not have pain or tenderness in these areas to further workup, such as a CT scan, but you may require this in the future if your symptoms persist. ?

## 2021-05-13 NOTE — ED Triage Notes (Signed)
Patient arrives ambulatory POV c/o abdominal cramping and N/V/D onset of last night. States started after eating some fried chicken.  ?

## 2021-08-20 ENCOUNTER — Other Ambulatory Visit (HOSPITAL_BASED_OUTPATIENT_CLINIC_OR_DEPARTMENT_OTHER): Payer: Self-pay

## 2021-08-20 ENCOUNTER — Ambulatory Visit: Payer: 59 | Admitting: Family

## 2021-08-20 VITALS — BP 130/84 | HR 107 | Temp 98.3°F | Resp 16 | Ht 68.0 in | Wt 298.0 lb

## 2021-08-20 DIAGNOSIS — J019 Acute sinusitis, unspecified: Secondary | ICD-10-CM | POA: Diagnosis not present

## 2021-08-20 DIAGNOSIS — J029 Acute pharyngitis, unspecified: Secondary | ICD-10-CM | POA: Diagnosis not present

## 2021-08-20 LAB — POCT RAPID STREP A (OFFICE): Rapid Strep A Screen: NEGATIVE

## 2021-08-20 MED ORDER — AMOXICILLIN-POT CLAVULANATE 875-125 MG PO TABS
1.0000 | ORAL_TABLET | Freq: Two times a day (BID) | ORAL | 0 refills | Status: AC
  Filled 2021-08-20: qty 20, 10d supply, fill #0

## 2021-08-20 MED ORDER — FLUTICASONE PROPIONATE 50 MCG/ACT NA SUSP
2.0000 | Freq: Every day | NASAL | 6 refills | Status: DC
  Filled 2021-08-20: qty 16, 30d supply, fill #0

## 2021-08-20 NOTE — Progress Notes (Signed)
  John Porter is a 36 y.o. male with the following history as recorded in EpicCare:  Patient Active Problem List   Diagnosis Date Noted   Enuresis 02/05/2021   Acute hypoxemic respiratory failure due to COVID-19 (HCC) 03/21/2019   Acute hypoxemic respiratory failure due to severe acute respiratory syndrome coronavirus 2 (SARS-CoV-2) disease (HCC) 03/21/2019   Obesity, Class III, BMI 40-49.9 (morbid obesity) (HCC) 03/21/2019   Allergic rhinitis 06/04/2018   Flu-like symptoms 12/29/2016   Hyperglycemia 03/11/2016   Possible exposure to STD 01/14/2014   Hyperlipidemia 06/01/2011   Genital herpes 05/17/2011   Encounter for well adult exam with abnormal findings 05/12/2011    Current Outpatient Medications  Medication Sig Dispense Refill   amoxicillin-clavulanate (AUGMENTIN) 875-125 MG tablet Take 1 tablet by mouth 2 (two) times daily for 10 days. 20 tablet 0   fluticasone (FLONASE) 50 MCG/ACT nasal spray Place 2 sprays into both nostrils daily. 16 g 1   fluticasone (FLONASE) 50 MCG/ACT nasal spray Place 2 sprays into both nostrils daily. 16 g 6   No current facility-administered medications for this visit.    Allergies: Patient has no known allergies.  Past Medical History:  Diagnosis Date   Hyperlipidemia 06/01/2011   Obesity     No past surgical history on file.  Family History  Problem Relation Age of Onset   Hypertension Mother    Hypertension Father    Hyperlipidemia Father    Asthma Father     Social History   Tobacco Use   Smoking status: Never   Smokeless tobacco: Never  Substance Use Topics   Alcohol use: No    Comment: rare    Subjective:   Sore throat/ headache x 1 week; no fever; feels pressure behind eyes; does have some history of allergies; no OTC medication.    Objective:  Vitals:   08/20/21 1525  BP: 130/84  Pulse: (!) 107  Resp: 16  Temp: 98.3 F (36.8 C)  TempSrc: Oral  SpO2: 95%  Weight: 298 lb (135.2 kg)  Height: 5\' 8"  (1.727 m)     General: Well developed, well nourished, in no acute distress  Skin : Warm and dry.  Head: Normocephalic and atraumatic  Eyes: Sclera and conjunctiva clear; pupils round and reactive to light; extraocular movements intact  Ears: External normal; canals clear; tympanic membranes normal  Oropharynx: Pink, supple. No suspicious lesions  Neck: Supple without thyromegaly, adenopathy  Lungs: Respirations unlabored; clear to auscultation bilaterally without wheeze, rales, rhonchi  CVS exam: normal rate and regular rhythm.  Neurologic: Alert and oriented; speech intact; face symmetrical; moves all extremities well; CNII-XII intact without focal deficit   Assessment:  1. Sore throat   2. Acute sinusitis, recurrence not specified, unspecified location     Plan:  Rapid strep is negative; Rx for Augmentin and Flonase; increase fluids, rest and follow up worse, no better.   No follow-ups on file.  Orders Placed This Encounter  Procedures   POCT rapid strep A    Requested Prescriptions   Signed Prescriptions Disp Refills   amoxicillin-clavulanate (AUGMENTIN) 875-125 MG tablet 20 tablet 0    Sig: Take 1 tablet by mouth 2 (two) times daily for 10 days.   fluticasone (FLONASE) 50 MCG/ACT nasal spray 16 g 6    Sig: Place 2 sprays into both nostrils daily.

## 2021-08-24 ENCOUNTER — Other Ambulatory Visit: Payer: Self-pay | Admitting: Family

## 2021-08-24 ENCOUNTER — Encounter: Payer: Self-pay | Admitting: Family

## 2021-08-24 ENCOUNTER — Other Ambulatory Visit (HOSPITAL_BASED_OUTPATIENT_CLINIC_OR_DEPARTMENT_OTHER): Payer: Self-pay

## 2021-08-24 MED ORDER — PREDNISONE 20 MG PO TABS
ORAL_TABLET | ORAL | 0 refills | Status: DC
  Filled 2021-08-24: qty 9, 7d supply, fill #0

## 2021-09-20 ENCOUNTER — Encounter: Payer: 59 | Admitting: Internal Medicine

## 2021-09-23 ENCOUNTER — Ambulatory Visit (INDEPENDENT_AMBULATORY_CARE_PROVIDER_SITE_OTHER): Payer: 59 | Admitting: Internal Medicine

## 2021-09-23 ENCOUNTER — Encounter: Payer: Self-pay | Admitting: Internal Medicine

## 2021-09-23 VITALS — BP 138/84 | HR 73 | Temp 97.8°F | Ht 68.0 in | Wt 305.0 lb

## 2021-09-23 DIAGNOSIS — R739 Hyperglycemia, unspecified: Secondary | ICD-10-CM

## 2021-09-23 DIAGNOSIS — E538 Deficiency of other specified B group vitamins: Secondary | ICD-10-CM | POA: Diagnosis not present

## 2021-09-23 DIAGNOSIS — E559 Vitamin D deficiency, unspecified: Secondary | ICD-10-CM

## 2021-09-23 DIAGNOSIS — Z0001 Encounter for general adult medical examination with abnormal findings: Secondary | ICD-10-CM | POA: Diagnosis not present

## 2021-09-23 DIAGNOSIS — R030 Elevated blood-pressure reading, without diagnosis of hypertension: Secondary | ICD-10-CM

## 2021-09-23 DIAGNOSIS — E7800 Pure hypercholesterolemia, unspecified: Secondary | ICD-10-CM | POA: Diagnosis not present

## 2021-09-23 LAB — BASIC METABOLIC PANEL
BUN: 12 mg/dL (ref 6–23)
CO2: 29 mEq/L (ref 19–32)
Calcium: 9.6 mg/dL (ref 8.4–10.5)
Chloride: 100 mEq/L (ref 96–112)
Creatinine, Ser: 1.17 mg/dL (ref 0.40–1.50)
GFR: 79.89 mL/min (ref >60.00)
Glucose, Bld: 86 mg/dL (ref 70–99)
Potassium: 4.2 mEq/L (ref 3.5–5.1)
Sodium: 137 mEq/L (ref 135–145)

## 2021-09-23 LAB — URINALYSIS, ROUTINE W REFLEX MICROSCOPIC
Bilirubin Urine: NEGATIVE
Hgb urine dipstick: NEGATIVE
Ketones, ur: NEGATIVE
Leukocytes,Ua: NEGATIVE
Nitrite: NEGATIVE
RBC / HPF: NONE SEEN (ref 0–?)
Specific Gravity, Urine: 1.025 (ref 1.000–1.030)
Total Protein, Urine: NEGATIVE
Urine Glucose: NEGATIVE
Urobilinogen, UA: 0.2 (ref 0.0–1.0)
pH: 6 (ref 5.0–8.0)

## 2021-09-23 LAB — CBC WITH DIFFERENTIAL/PLATELET
Basophils Absolute: 0.1 10*3/uL (ref 0.0–0.1)
Basophils Relative: 1.5 % (ref 0.0–3.0)
Eosinophils Absolute: 0.1 10*3/uL (ref 0.0–0.7)
Eosinophils Relative: 1 % (ref 0.0–5.0)
HCT: 41.1 % (ref 39.0–52.0)
Hemoglobin: 12.8 g/dL — ABNORMAL LOW (ref 13.0–17.0)
Lymphocytes Relative: 37 % (ref 12.0–46.0)
Lymphs Abs: 2.3 10*3/uL (ref 0.7–4.0)
MCHC: 31.2 g/dL (ref 30.0–36.0)
MCV: 72.5 fl — ABNORMAL LOW (ref 78.0–100.0)
Monocytes Absolute: 0.3 10*3/uL (ref 0.1–1.0)
Monocytes Relative: 5.1 % (ref 3.0–12.0)
Neutro Abs: 3.4 10*3/uL (ref 1.4–7.7)
Neutrophils Relative %: 55.4 % (ref 43.0–77.0)
Platelets: 262 10*3/uL (ref 150.0–400.0)
RBC: 5.67 Mil/uL (ref 4.22–5.81)
RDW: 16 % — ABNORMAL HIGH (ref 11.5–15.5)
WBC: 6.1 10*3/uL (ref 4.0–10.5)

## 2021-09-23 LAB — HEPATIC FUNCTION PANEL
ALT: 28 U/L (ref 0–53)
AST: 21 U/L (ref 0–37)
Albumin: 4.7 g/dL (ref 3.5–5.2)
Alkaline Phosphatase: 54 U/L (ref 39–117)
Bilirubin, Direct: 0.1 mg/dL (ref 0.0–0.3)
Total Bilirubin: 0.5 mg/dL (ref 0.2–1.2)
Total Protein: 7.9 g/dL (ref 6.0–8.3)

## 2021-09-23 LAB — VITAMIN D 25 HYDROXY (VIT D DEFICIENCY, FRACTURES): VITD: <7 ng/mL — ABNORMAL LOW (ref 30.00–100.00)

## 2021-09-23 LAB — HEMOGLOBIN A1C: Hgb A1c MFr Bld: 6 % (ref 4.6–6.5)

## 2021-09-23 LAB — LIPID PANEL
Cholesterol: 201 mg/dL — ABNORMAL HIGH (ref 0–200)
HDL: 40.4 mg/dL (ref >39.00)
LDL Cholesterol: 136 mg/dL — ABNORMAL HIGH (ref 0–99)
NonHDL: 160.64
Total CHOL/HDL Ratio: 5
Triglycerides: 123 mg/dL (ref 0.0–149.0)
VLDL: 24.6 mg/dL (ref 0.0–40.0)

## 2021-09-23 LAB — VITAMIN B12: Vitamin B-12: 236 pg/mL (ref 211–911)

## 2021-09-23 LAB — TSH: TSH: 1.73 u[IU]/mL (ref 0.35–5.50)

## 2021-09-23 NOTE — Patient Instructions (Signed)
Please check with your HR department to see if they would help pay for [facility] shots for weight loss  Please continue to monitor your BP at home, with a goal to be at least less than 140/90 (or less than 130/80 would be better)  Please continue all other medications as before, and refills have been done if requested.  Please have the pharmacy call with any other refills you may need.  Please continue your efforts at being more active, low cholesterol diet, and weight control.  You are otherwise up to date with prevention measures today.  Please keep your appointments with your specialists as you may have planned  Please go to the LAB at the blood drawing area for the tests to be done  You will be contacted by phone if any changes need to be made immediately.  Otherwise, you will receive a letter about your results with an explanation, but please check with MyChart first.  Please remember to sign up for MyChart if you have not done so, as this will be important to you in the future with finding out test results, communicating by private email, and scheduling acute appointments online when needed.  Please make an Appointment to return for your 1 year visit, or sooner if needed

## 2021-09-23 NOTE — Progress Notes (Signed)
Patient ID: John Porter, male   DOB: 1985/10/09, 36 y.o.   MRN: [id number]         Chief Complaint:: wellness exam and Office Visit (Discuss b-12 and vitamin d)  , hyperglycemia, elevated BP, hld       HPI:  John Porter is a 36 y.o. male here for wellness exam; up to date                        Also not taking Vit D or B12.  Pt denies chest pain, increased sob or doe, wheezing, orthopnea, PND, increased LE swelling, palpitations, dizziness or syncope.   Pt denies polydipsia, polyuria, or new focal neuro s/s.    Pt denies fever, wt loss, night sweats, loss of appetite, or other constitutional symptoms  Pt states BP has been < 140/90 at home.  Not really trying to follow lower chol diet recently.     Wt Readings from Last 3 Encounters:  09/23/21 (!) 305 lb (138.3 kg)  08/20/21 298 lb (135.2 kg)  05/13/21 290 lb (131.5 kg)   BP Readings from Last 3 Encounters:  09/23/21 138/84  08/20/21 130/84  05/13/21 (!) 134/100   Immunization History  Administered Date(s) Administered   Influenza,inj,Quad PF,6+ Mos 12/25/2014, 11/16/2015   Influenza-Unspecified 12/07/2016, 11/07/2018, 02/04/2021   Moderna Sars-Covid-2 Vaccination 07/22/2019   Td 02/14/2006   Tdap 03/02/2017   There are no preventive care reminders to display for this patient.     Past Medical History:  Diagnosis Date   Hyperlipidemia 06/01/2011   Obesity    History reviewed. No pertinent surgical history.  reports that he has never smoked. He has never used smokeless tobacco. He reports that he does not drink alcohol and does not use drugs. family history includes Asthma in his father; Hyperlipidemia in his father; Hypertension in his father and mother. No Known Allergies Current Outpatient Medications on File Prior to Visit  Medication Sig Dispense Refill   fluticasone (FLONASE) 50 MCG/ACT nasal spray Place 2 sprays into both nostrils daily. 16 g 6   No current facility-administered medications on file prior to visit.         ROS:  All others reviewed and negative.  Objective        PE:  BP 138/84 (BP Location: Right Arm, Patient Position: Sitting, Cuff Size: Large)   Pulse 73   Temp 97.8 F (36.6 C) (Oral)   Ht 5\' 8"  (1.727 m)   Wt (!) 305 lb (138.3 kg)   SpO2 97%   BMI 46.38 kg/m                 Constitutional: Pt appears in NAD               HENT: Head: NCAT.                Right Ear: External ear normal.                 Left Ear: External ear normal.                Eyes: . Pupils are equal, round, and reactive to light. Conjunctivae and EOM are normal               Nose: without d/c or deformity               Neck: Neck supple. Gross normal ROM  Cardiovascular: Normal rate and regular rhythm.                 Pulmonary/Chest: Effort normal and breath sounds without rales or wheezing.                Abd:  Soft, NT, ND, + BS, no organomegaly               Neurological: Pt is alert. At baseline orientation, motor grossly intact               Skin: Skin is warm. No rashes, no other new lesions, LE edema - none               Psychiatric: Pt behavior is normal without agitation   Micro: none  Cardiac tracings I have personally interpreted today:  none  Pertinent Radiological findings (summarize): none   Lab Results  Component Value Date   WBC 6.1 09/23/2021   HGB 12.8 (L) 09/23/2021   HCT 41.1 09/23/2021   PLT 262.0 09/23/2021   GLUCOSE 86 09/23/2021   CHOL 201 (H) 09/23/2021   TRIG 123.0 09/23/2021   HDL 40.40 09/23/2021   LDLDIRECT 133.0 09/12/2018   LDLCALC 136 (H) 09/23/2021   ALT 28 09/23/2021   AST 21 09/23/2021   NA 137 09/23/2021   K 4.2 09/23/2021   CL 100 09/23/2021   CREATININE 1.17 09/23/2021   BUN 12 09/23/2021   CO2 29 09/23/2021   TSH 1.73 09/23/2021   INR 1.2 03/21/2019   HGBA1C 6.0 09/23/2021   Assessment/Plan:  John Porter is a 36 y.o. Black or African American [2] male with  has a past medical history of Hyperlipidemia (06/01/2011) and  Obesity.  Encounter for well adult exam with abnormal findings Age and sex appropriate education and counseling updated with regular exercise and diet Referrals for preventative services - none needed Immunizations addressed - none needed Smoking counseling  - none needed Evidence for depression or other mood disorder - none significant Most recent labs reviewed. I have personally reviewed and have noted: 1) the patient's medical and social history 2) The patient's current medications and supplements 3) The patient's height, weight, and BMI have been recorded in the chart   Hyperlipidemia Lab Results  Component Value Date   LDLCALC 136 (H) 09/23/2021   uncontrolled, pt to continue to work on lower cholesterol diet   Hyperglycemia Lab Results  Component Value Date   HGBA1C 6.0 09/23/2021   Stable, pt to continue current medical treatment  - diet, wt control, excercise   B12 deficiency Lab Results  Component Value Date   VITAMINB12 236 09/23/2021   Low, to start oral replacement - b12 1000 mcg qd   Vitamin D deficiency Last vitamin D Lab Results  Component Value Date   VD25OH <7.00 (L) 09/23/2021   Low, to start oral replacement   Blood pressure elevated without history of HTN BP Readings from Last 3 Encounters:  09/23/21 138/84  08/20/21 130/84  05/13/21 (!) 134/100   Mild to borderline ucontrolled, pt declines start BP med tx, for wt loss, low salt,, excercise  Followup: Return in about 1 year (around 09/24/2022).  Oliver Barre, MD 09/25/2021 7:26 PM [facility]

## 2021-09-25 ENCOUNTER — Encounter: Payer: Self-pay | Admitting: Internal Medicine

## 2021-09-25 DIAGNOSIS — E559 Vitamin D deficiency, unspecified: Secondary | ICD-10-CM | POA: Insufficient documentation

## 2021-09-25 DIAGNOSIS — R030 Elevated blood-pressure reading, without diagnosis of hypertension: Secondary | ICD-10-CM | POA: Insufficient documentation

## 2021-09-25 DIAGNOSIS — E538 Deficiency of other specified B group vitamins: Secondary | ICD-10-CM | POA: Insufficient documentation

## 2021-09-25 NOTE — Assessment & Plan Note (Signed)
Lab Results  Component Value Date   VITAMINB12 236 09/23/2021   Low, to start oral replacement - b12 1000 mcg qd

## 2021-09-25 NOTE — Assessment & Plan Note (Signed)

## 2021-09-25 NOTE — Assessment & Plan Note (Signed)
Lab Results  Component Value Date   LDLCALC 136 (H) 09/23/2021   uncontrolled, pt to continue to work on lower cholesterol diet

## 2021-09-25 NOTE — Assessment & Plan Note (Signed)
Last vitamin D Lab Results  Component Value Date   VD25OH <7.00 (L) 09/23/2021   Low, to start oral replacement

## 2021-09-25 NOTE — Assessment & Plan Note (Signed)
BP Readings from Last 3 Encounters:  09/23/21 138/84  08/20/21 130/84  05/13/21 (!) 134/100   Mild to borderline ucontrolled, pt declines start BP med tx, for wt loss, low salt,, excercise

## 2021-09-25 NOTE — Assessment & Plan Note (Signed)
Lab Results  Component Value Date   HGBA1C 6.0 09/23/2021   Stable, pt to continue current medical treatment  - diet, wt control, excercise

## 2022-01-10 ENCOUNTER — Ambulatory Visit: Payer: 59 | Admitting: Family Medicine

## 2022-01-10 ENCOUNTER — Other Ambulatory Visit (HOSPITAL_BASED_OUTPATIENT_CLINIC_OR_DEPARTMENT_OTHER): Payer: Self-pay

## 2022-01-10 ENCOUNTER — Encounter: Payer: Self-pay | Admitting: Family Medicine

## 2022-01-10 VITALS — BP 122/77 | HR 116 | Temp 100.4°F | Resp 20 | Ht 68.0 in | Wt 303.4 lb

## 2022-01-10 DIAGNOSIS — J020 Streptococcal pharyngitis: Secondary | ICD-10-CM | POA: Diagnosis not present

## 2022-01-10 LAB — POCT RAPID STREP A (OFFICE): Rapid Strep A Screen: POSITIVE — AB

## 2022-01-10 MED ORDER — AMOXICILLIN-POT CLAVULANATE 875-125 MG PO TABS
1.0000 | ORAL_TABLET | Freq: Two times a day (BID) | ORAL | 0 refills | Status: DC
  Filled 2022-01-10: qty 20, 10d supply, fill #0

## 2022-01-10 NOTE — Progress Notes (Signed)
Acute Office Visit  Subjective:     Patient ID: John Porter, male    DOB: Aug 21, 1985, 36 y.o.   MRN: [id number]  Chief Complaint  Patient presents with   Possble strep    Pt is here today for possible strep throat   Sore Throat    Sore Throat: Patient complains of sore throat. Associated symptoms include fevers up to 103.3 degrees, chills, headache, hoarseness, nasal blockage, post nasal drip, sinus and nasal congestion, and sore throat.Onset of symptoms was a few days ago, unchanged since that time. He is drinking moderate amounts of fluids. He has not had recent close exposure to someone with proven streptococcal pharyngitis. He reports frequent strep infections - last one was last year.    Review of Systems  Constitutional:  Positive for chills and fever.  HENT:  Positive for congestion and sore throat. Negative for ear discharge, ear pain, hearing loss and sinus pain.   Eyes:  Negative for pain and discharge.  Respiratory:  Negative for sputum production and shortness of breath.   Cardiovascular:  Negative for chest pain and palpitations.  Gastrointestinal:  Negative for abdominal pain, nausea and vomiting.  Skin:  Negative for rash.  Neurological:  Positive for headaches.   All review of systems negative except what is listed in the HPI      Objective:    BP 122/77   Pulse (!) 116   Temp (!) 100.4 F (38 C)   Resp 20   Ht 5\' 8"  (1.727 m)   Wt (!) 303 lb 6.4 oz (137.6 kg)   SpO2 100%   BMI 46.13 kg/m    Physical Exam Vitals reviewed.  Constitutional:      Appearance: Normal appearance.  HENT:     Left Ear: Tympanic membrane is erythematous.     Mouth/Throat:     Pharynx: Posterior oropharyngeal erythema present.  Cardiovascular:     Rate and Rhythm: Normal rate and regular rhythm.     Pulses: Normal pulses.     Heart sounds: Normal heart sounds.  Pulmonary:     Effort: Pulmonary effort is normal.     Breath sounds: Normal breath sounds.   Musculoskeletal:     Cervical back: Normal range of motion and neck supple. Tenderness present.  Lymphadenopathy:     Cervical: Cervical adenopathy present.  Skin:    General: Skin is warm and dry.  Neurological:     Mental Status: He is alert and oriented to person, place, and time.  Psychiatric:        Mood and Affect: Mood normal.        Behavior: Behavior normal.        Thought Content: Thought content normal.        Judgment: Judgment normal.      Results for orders placed or performed in visit on 01/10/22  POCT rapid strep A  Result Value Ref Range   Rapid Strep A Screen Positive (A) Negative        Assessment & Plan:   Problem List Items Addressed This Visit   None Visit Diagnoses     Strep throat    -  Primary Strep positive.  Adding Augmentin to supportive measures Continue supportive measures including rest, hydration, humidifier use, steam showers, warm compresses to sinuses, warm liquids with lemon and honey, and over-the-counter cough, cold, and analgesics as needed.  Patient aware of signs/symptoms requiring further/urgent evaluation.    Relevant Orders   POCT rapid  strep A       Meds ordered this encounter  Medications   amoxicillin-clavulanate (AUGMENTIN) 875-125 MG tablet    Sig: Take 1 tablet by mouth 2 (two) times daily.    Dispense:  20 tablet    Refill:  0    Return if symptoms worsen or fail to improve.  Clayborne Dana, NP

## 2022-02-03 ENCOUNTER — Other Ambulatory Visit: Payer: Self-pay | Admitting: Family Medicine

## 2022-02-03 DIAGNOSIS — J020 Streptococcal pharyngitis: Secondary | ICD-10-CM

## 2022-02-08 ENCOUNTER — Other Ambulatory Visit (HOSPITAL_BASED_OUTPATIENT_CLINIC_OR_DEPARTMENT_OTHER): Payer: Self-pay

## 2022-07-12 ENCOUNTER — Telehealth: Payer: 59 | Admitting: Physician Assistant

## 2022-07-12 DIAGNOSIS — J208 Acute bronchitis due to other specified organisms: Secondary | ICD-10-CM

## 2022-07-12 DIAGNOSIS — B9689 Other specified bacterial agents as the cause of diseases classified elsewhere: Secondary | ICD-10-CM

## 2022-07-12 MED ORDER — AZITHROMYCIN 250 MG PO TABS: ORAL_TABLET | ORAL | 0 refills | Status: AC

## 2022-07-12 MED ORDER — BENZONATATE 100 MG PO CAPS: 100.0000 mg | ORAL_CAPSULE | Freq: Three times a day (TID) | ORAL | 0 refills | Status: DC | PRN

## 2022-07-12 NOTE — Patient Instructions (Signed)
[name], thank you for joining Piedad Climes, PA-C for today's virtual visit.  While this provider is not your primary care provider (PCP), if your PCP is located in our provider database this encounter information will be shared with them immediately following your visit.   A [facility] MyChart account gives you access to today's visit and all your visits, tests, and labs performed at [facility] " click here if you don't have a [place] MyChart account or go to [email or web address]  Consent: (Patient) SAY FRIPP provided verbal consent for this virtual visit at the beginning of the encounter.  Current Medications:  Current Outpatient Medications:    azithromycin (ZITHROMAX) 250 MG tablet, Take 2 tablets on day 1, then 1 tablet daily on days 2 through 5, Disp: 6 tablet, Rfl: 0   benzonatate (TESSALON) 100 MG capsule, Take 1 capsule (100 mg total) by mouth 3 (three) times daily as needed for cough., Disp: 30 capsule, Rfl: 0   Medications ordered in this encounter:  Meds ordered this encounter  Medications   azithromycin (ZITHROMAX) 250 MG tablet    Sig: Take 2 tablets on day 1, then 1 tablet daily on days 2 through 5    Dispense:  6 tablet    Refill:  0    Order Specific Question:   Supervising Provider    Answer:   Merrilee Jansky [[id number]]   benzonatate (TESSALON) 100 MG capsule    Sig: Take 1 capsule (100 mg total) by mouth 3 (three) times daily as needed for cough.    Dispense:  30 capsule    Refill:  0    Order Specific Question:   Supervising Provider    Answer:   Merrilee Jansky [id number]     *If you need refills on other medications prior to your next appointment, please contact your pharmacy*  Follow-Up: Call back or seek an in-person evaluation if the symptoms worsen or if the condition fails to improve as anticipated.  [facility] [phone]  Other Instructions Take antibiotic (Azithromycin) as directed.   Increase fluids.  Get plenty of rest. Use Mucinex for congestion. Tessalon to help with the cough. Take a daily probiotic (I recommend Align or Culturelle, but even Activia Yogurt may be beneficial).  A humidifier placed in the bedroom may offer some relief for a dry, scratchy throat of nasal irritation.  Read information below on acute bronchitis. Please call or return to clinic if symptoms are not improving.  Acute Bronchitis Bronchitis is when the airways that extend from the windpipe into the lungs get red, puffy, and painful (inflamed). Bronchitis often causes thick spit (mucus) to develop. This leads to a cough. A cough is the most common symptom of bronchitis. In acute bronchitis, the condition usually begins suddenly and goes away over time (usually in 2 weeks). Smoking, allergies, and asthma can make bronchitis worse. Repeated episodes of bronchitis may cause more lung problems.  HOME CARE Rest. Drink enough fluids to keep your pee (urine) clear or pale yellow (unless you need to limit fluids as told by your doctor). Only take over-the-counter or prescription medicines as told by your doctor. Avoid smoking and secondhand smoke. These can make bronchitis worse. If you are a smoker, think about using nicotine gum or skin patches. Quitting smoking will help your lungs heal faster. Reduce the chance of getting bronchitis again by: Washing your hands often. Avoiding people with cold symptoms. Trying not to touch your  hands to your mouth, nose, or eyes. Follow up with your doctor as told.  GET HELP IF: Your symptoms do not improve after 1 week of treatment. Symptoms include: Cough. Fever. Coughing up thick spit. Body aches. Chest congestion. Chills. Shortness of breath. Sore throat.  GET HELP RIGHT AWAY IF:  You have an increased fever. You have chills. You have severe shortness of breath. You have bloody thick spit (sputum). You throw up (vomit) often. You lose too much body  fluid (dehydration). You have a severe headache. You faint.  MAKE SURE YOU:  Understand these instructions. Will watch your condition. Will get help right away if you are not doing well or get worse. Document Released: 07/20/2007 Document Revised: 10/03/2012 Document Reviewed: 07/24/2012 [facility] Patient Information 2015 [place], [place]. This information is not intended to replace advice given to you by your health care provider. Make sure you discuss any questions you have with your health care provider.    If you have been instructed to have an in-person evaluation today at a local Urgent Care facility, please use the link below. It will take you to a list of all of our available [facility], including address, phone number and hours of operation. Please do not delay care.  [facility]  If you or a family member do not have a primary care provider, use the link below to schedule a visit and establish care. When you choose a [facility] primary care physician or advanced practice provider, you gain a long-term partner in health. Find a Primary Care Provider  Learn more about [facility] in-office and virtual care options: [facility] - Get Care Now

## 2022-07-12 NOTE — Progress Notes (Signed)
Virtual Visit Consent   John Porter, you are scheduled for a virtual visit with a [facility] provider today. Just as with appointments in the office, your consent must be obtained to participate. Your consent will be active for this visit and any virtual visit you may have with one of our providers in the next 365 days. If you have a MyChart account, a copy of this consent can be sent to you electronically.  As this is a virtual visit, video technology does not allow for your provider to perform a traditional examination. This may limit your provider's ability to fully assess your condition. If your provider identifies any concerns that need to be evaluated in person or the need to arrange testing (such as labs, EKG, etc.), we will make arrangements to do so. Although advances in technology are sophisticated, we cannot ensure that it will always work on either your end or our end. If the connection with a video visit is poor, the visit may have to be switched to a telephone visit. With either a video or telephone visit, we are not always able to ensure that we have a secure connection.  By engaging in this virtual visit, you consent to the provision of healthcare and authorize for your insurance to be billed (if applicable) for the services provided during this visit. Depending on your insurance coverage, you may receive a charge related to this service.  I need to obtain your verbal consent now. Are you willing to proceed with your visit today? John Porter has provided verbal consent on 07/12/2022 for a virtual visit (video or telephone). Piedad Climes, [place]  Date: 07/12/2022 8:06 AM  Virtual Visit via Video Note   I, Piedad Climes, connected with  John Porter  ([id number], May 14, 1985) on 07/12/22 at  8:00 AM EDT by a video-enabled telemedicine application and verified that I am speaking with the correct person using two identifiers.  Location: Patient: Virtual Visit Location Patient:  Home Provider: Virtual Visit Location Provider: Mobile   I discussed the limitations of evaluation and management by telemedicine and the availability of in person appointments. The patient expressed understanding and agreed to proceed.    History of Present Illness: John Porter is a 37 y.o. who identifies as a male who was assigned male at birth, and is being seen today for URI symptoms starting around last week with cough and congestion. Notes symptoms have been progressively worsening since onset, now with thick green/brown sputum. Cough is keeping him away at night. Now with low-grade fever. Denies recent travel or sick contact.  OTC -- Mucinex-D  HPI: HPI  Problems:  Patient Active Problem List   Diagnosis Date Noted   B12 deficiency 09/25/2021   Vitamin D deficiency 09/25/2021   Blood pressure elevated without history of HTN 09/25/2021   Enuresis 02/05/2021   Acute hypoxemic respiratory failure due to COVID-19 [facility]) 03/21/2019   Acute hypoxemic respiratory failure due to severe acute respiratory syndrome coronavirus 2 (SARS-CoV-2) disease (HCC) 03/21/2019   Obesity, Class III, BMI 40-49.9 (morbid obesity) (HCC) 03/21/2019   Allergic rhinitis 06/04/2018   Flu-like symptoms 12/29/2016   Hyperglycemia 03/11/2016   Possible exposure to STD 01/14/2014   Hyperlipidemia 06/01/2011   Genital herpes 05/17/2011   Encounter for well adult exam with abnormal findings 05/12/2011    Allergies: No Known Allergies Medications:  Current Outpatient Medications:    azithromycin (ZITHROMAX) 250 MG tablet, Take 2 tablets on day 1, then 1  tablet daily on days 2 through 5, Disp: 6 tablet, Rfl: 0   benzonatate (TESSALON) 100 MG capsule, Take 1 capsule (100 mg total) by mouth 3 (three) times daily as needed for cough., Disp: 30 capsule, Rfl: 0  Observations/Objective: Patient is well-developed, well-nourished in no acute distress.  Resting comfortably at home.  Head is normocephalic, atraumatic.   No labored breathing. Speech is clear and coherent with logical content.  Patient is alert and oriented at baseline.   Assessment and Plan: 1. Acute bacterial bronchitis - azithromycin (ZITHROMAX) 250 MG tablet; Take 2 tablets on day 1, then 1 tablet daily on days 2 through 5  Dispense: 6 tablet; Refill: 0 - benzonatate (TESSALON) 100 MG capsule; Take 1 capsule (100 mg total) by mouth 3 (three) times daily as needed for cough.  Dispense: 30 capsule; Refill: 0  Rx Azithromycin.  Increase fluids.  Rest.  Saline nasal spray.  Probiotic.  Mucinex as directed.  Humidifier in bedroom. Tessalon per orders.  Call or return to clinic if symptoms are not improving.   Follow Up Instructions: I discussed the assessment and treatment plan with the patient. The patient was provided an opportunity to ask questions and all were answered. The patient agreed with the plan and demonstrated an understanding of the instructions.  A copy of instructions were sent to the patient via MyChart unless otherwise noted below.   The patient was advised to call back or seek an in-person evaluation if the symptoms worsen or if the condition fails to improve as anticipated.  Time:  I spent 10 minutes with the patient via telehealth technology discussing the above problems/concerns.    Piedad Climes, PA-C

## 2022-07-15 ENCOUNTER — Ambulatory Visit: Payer: 59 | Admitting: Internal Medicine

## 2022-08-05 ENCOUNTER — Encounter: Payer: Self-pay | Admitting: Family Medicine

## 2022-08-05 ENCOUNTER — Other Ambulatory Visit (HOSPITAL_BASED_OUTPATIENT_CLINIC_OR_DEPARTMENT_OTHER): Payer: Self-pay

## 2022-08-05 ENCOUNTER — Ambulatory Visit: Payer: 59 | Admitting: Family Medicine

## 2022-08-05 VITALS — BP 132/94 | HR 94 | Temp 97.9°F | Ht 68.0 in | Wt 316.2 lb

## 2022-08-05 DIAGNOSIS — R059 Cough, unspecified: Secondary | ICD-10-CM | POA: Diagnosis not present

## 2022-08-05 MED ORDER — PREDNISONE 10 MG PO TABS
ORAL_TABLET | ORAL | 0 refills | Status: AC
  Filled 2022-08-05: qty 21, 6d supply, fill #0

## 2022-08-05 NOTE — Patient Instructions (Signed)
-  It was a pleasure to care for you today. -START Prednisone 10mg , 6 day taper.  -Do not take NSAIDS, such as Ibuprofen and Aleve while taking Prednisone. -Follow up with PCP if not improved.

## 2022-08-05 NOTE — Progress Notes (Signed)
Acute Office Visit   Subjective:  Patient ID: John Porter, male    DOB: 01-Mar-1985, 37 y.o.   MRN: [id number]  Chief Complaint  Patient presents with   Cough    Pt is here today with C/O of cough Sx started 2 weeks ago. Denies headaches,vomiting,fever. Mucinex-1x daily Pt has been seen for this [facility] visit.    Cough   Patient is a 37 year old [place] American male that presents with a non productive cough that started about 3 weeks ago. He was seen via telehealth on 07/12/2022 by Marcelline Mates, PA, dx with acute bacterial bronchitis. He was prescribed Azithromycin and Benzonatate. He reports the cough stayed about about the same up till Monday. On Monday, decreased in the amount of coughing and changed to a non productive cough. Also, reports nasal congestion and drainage, started on Tuesday or Wednesday.   Denies fever, chest pain, SHOB, headaches, sore throat, ear ache or drainage, or nausea/vomiting.   He reports he has been taking Mucinex-DM and Delsym. He reports this has helped some.   Review of Systems  Respiratory:  Positive for cough.    See HPI above      Objective:   BP (!) 132/94   Pulse 94   Temp 97.9 F (36.6 C)   Ht 5\' 8"  (1.727 m)   Wt (!) 316 lb 4 oz (143.5 kg)   SpO2 95%   BMI 48.09 kg/m    Physical Exam Vitals reviewed.  Constitutional:      General: He is not in acute distress.    Appearance: Normal appearance. He is obese. He is not ill-appearing, toxic-appearing or diaphoretic.  HENT:     Head: Normocephalic and atraumatic.     Right Ear: Tympanic membrane, ear canal and external ear normal. There is no impacted cerumen.     Left Ear: Tympanic membrane, ear canal and external ear normal. There is no impacted cerumen.     Nose:     Right Sinus: No maxillary sinus tenderness or frontal sinus tenderness.     Left Sinus: No maxillary sinus tenderness or frontal sinus tenderness.     Mouth/Throat:     Pharynx: Oropharynx is clear. Uvula  midline. No pharyngeal swelling, oropharyngeal exudate, posterior oropharyngeal erythema or uvula swelling.  Eyes:     General:        Right eye: No discharge.        Left eye: No discharge.     Conjunctiva/sclera: Conjunctivae normal.  Cardiovascular:     Rate and Rhythm: Normal rate and regular rhythm.     Heart sounds: Normal heart sounds. No murmur heard.    No friction rub. No gallop.  Pulmonary:     Effort: Pulmonary effort is normal. No respiratory distress.     Breath sounds: Normal breath sounds.  Musculoskeletal:        General: Normal range of motion.  Lymphadenopathy:     Head:     Right side of head: No submental or submandibular adenopathy.     Left side of head: No submental or submandibular adenopathy.  Skin:    General: Skin is warm and dry.  Neurological:     General: No focal deficit present.     Mental Status: He is alert and oriented to person, place, and time. Mental status is at baseline.  Psychiatric:        Mood and Affect: Mood normal.        Behavior: Behavior  normal.        Thought Content: Thought content normal.        Judgment: Judgment normal.       Assessment & Plan:  Cough, unspecified type -     predniSONE; Take 6 tablets (60 mg total) by mouth daily with breakfast for 1 day, THEN 5 tablets (50 mg total) daily with breakfast for 1 day, THEN 4 tablets (40 mg total) daily with breakfast for 1 day, THEN 3 tablets (30 mg total) daily with breakfast for 1 day, THEN 2 tablets (20 mg total) daily with breakfast for 1 day, THEN 1 tablet (10 mg total) daily with breakfast for 1 day.  Dispense: 21 tablet; Refill: 0  -Prescribed Prednisone 10mg , 6 day taper. Advised not to take NSAIDS while taking Prednisone.  -Follow up with PCP if not improved.  Zandra Abts, NP

## 2022-08-12 ENCOUNTER — Encounter: Payer: Self-pay | Admitting: Internal Medicine

## 2022-08-12 ENCOUNTER — Other Ambulatory Visit (HOSPITAL_BASED_OUTPATIENT_CLINIC_OR_DEPARTMENT_OTHER): Payer: Self-pay

## 2022-08-12 ENCOUNTER — Ambulatory Visit: Payer: 59 | Admitting: Internal Medicine

## 2022-08-12 ENCOUNTER — Ambulatory Visit (INDEPENDENT_AMBULATORY_CARE_PROVIDER_SITE_OTHER): Payer: 59

## 2022-08-12 VITALS — BP 118/78 | HR 100 | Temp 98.6°F | Ht 68.0 in | Wt 313.0 lb

## 2022-08-12 DIAGNOSIS — E7800 Pure hypercholesterolemia, unspecified: Secondary | ICD-10-CM | POA: Diagnosis not present

## 2022-08-12 DIAGNOSIS — R059 Cough, unspecified: Secondary | ICD-10-CM

## 2022-08-12 DIAGNOSIS — K219 Gastro-esophageal reflux disease without esophagitis: Secondary | ICD-10-CM

## 2022-08-12 DIAGNOSIS — E559 Vitamin D deficiency, unspecified: Secondary | ICD-10-CM

## 2022-08-12 DIAGNOSIS — Z0001 Encounter for general adult medical examination with abnormal findings: Secondary | ICD-10-CM | POA: Diagnosis not present

## 2022-08-12 DIAGNOSIS — R739 Hyperglycemia, unspecified: Secondary | ICD-10-CM | POA: Diagnosis not present

## 2022-08-12 DIAGNOSIS — E538 Deficiency of other specified B group vitamins: Secondary | ICD-10-CM

## 2022-08-12 DIAGNOSIS — Z125 Encounter for screening for malignant neoplasm of prostate: Secondary | ICD-10-CM | POA: Diagnosis not present

## 2022-08-12 DIAGNOSIS — J309 Allergic rhinitis, unspecified: Secondary | ICD-10-CM

## 2022-08-12 LAB — CBC WITH DIFFERENTIAL/PLATELET
Basophils Absolute: 0 10*3/uL (ref 0.0–0.1)
Basophils Relative: 0.3 % (ref 0.0–3.0)
Eosinophils Absolute: 0.1 10*3/uL (ref 0.0–0.7)
Eosinophils Relative: 0.9 % (ref 0.0–5.0)
HCT: 41.5 % (ref 39.0–52.0)
Hemoglobin: 12.9 g/dL — ABNORMAL LOW (ref 13.0–17.0)
Lymphocytes Relative: 38.6 % (ref 12.0–46.0)
Lymphs Abs: 2.4 10*3/uL (ref 0.7–4.0)
MCHC: 31.1 g/dL (ref 30.0–36.0)
MCV: 72.8 fl — ABNORMAL LOW (ref 78.0–100.0)
Monocytes Absolute: 0.4 10*3/uL (ref 0.1–1.0)
Monocytes Relative: 5.8 % (ref 3.0–12.0)
Neutro Abs: 3.3 10*3/uL (ref 1.4–7.7)
Neutrophils Relative %: 54.4 % (ref 43.0–77.0)
Platelets: 262 10*3/uL (ref 150.0–400.0)
RBC: 5.7 Mil/uL (ref 4.22–5.81)
RDW: 15.4 % (ref 11.5–15.5)
WBC: 6.1 10*3/uL (ref 4.0–10.5)

## 2022-08-12 LAB — URINALYSIS, ROUTINE W REFLEX MICROSCOPIC
Bilirubin Urine: NEGATIVE
Hgb urine dipstick: NEGATIVE
Ketones, ur: NEGATIVE
Leukocytes,Ua: NEGATIVE
Nitrite: NEGATIVE
RBC / HPF: NONE SEEN (ref 0–?)
Specific Gravity, Urine: 1.02 (ref 1.000–1.030)
Total Protein, Urine: NEGATIVE
Urine Glucose: NEGATIVE
Urobilinogen, UA: 0.2 (ref 0.0–1.0)
pH: 6 (ref 5.0–8.0)

## 2022-08-12 LAB — LIPID PANEL
Cholesterol: 177 mg/dL (ref 0–200)
HDL: 31.6 mg/dL — ABNORMAL LOW (ref >39.00)
LDL Cholesterol: 115 mg/dL — ABNORMAL HIGH (ref 0–99)
NonHDL: 145.47
Total CHOL/HDL Ratio: 6
Triglycerides: 150 mg/dL — ABNORMAL HIGH (ref 0.0–149.0)
VLDL: 30 mg/dL (ref 0.0–40.0)

## 2022-08-12 LAB — HEPATIC FUNCTION PANEL
ALT: 38 U/L (ref 0–53)
AST: 19 U/L (ref 0–37)
Albumin: 4.3 g/dL (ref 3.5–5.2)
Alkaline Phosphatase: 51 U/L (ref 39–117)
Bilirubin, Direct: 0.1 mg/dL (ref 0.0–0.3)
Total Bilirubin: 0.4 mg/dL (ref 0.2–1.2)
Total Protein: 6.8 g/dL (ref 6.0–8.3)

## 2022-08-12 LAB — BASIC METABOLIC PANEL
BUN: 19 mg/dL (ref 6–23)
CO2: 26 mEq/L (ref 19–32)
Calcium: 9.4 mg/dL (ref 8.4–10.5)
Chloride: 105 mEq/L (ref 96–112)
Creatinine, Ser: 1.23 mg/dL (ref 0.40–1.50)
GFR: 74.77 mL/min (ref >60.00)
Glucose, Bld: 93 mg/dL (ref 70–99)
Potassium: 4.2 mEq/L (ref 3.5–5.1)
Sodium: 139 mEq/L (ref 135–145)

## 2022-08-12 LAB — VITAMIN D 25 HYDROXY (VIT D DEFICIENCY, FRACTURES): VITD: 15.32 ng/mL — ABNORMAL LOW (ref 30.00–100.00)

## 2022-08-12 LAB — PSA: PSA: 0.42 ng/mL (ref 0.10–4.00)

## 2022-08-12 LAB — VITAMIN B12: Vitamin B-12: 248 pg/mL (ref 211–911)

## 2022-08-12 LAB — TSH: TSH: 2.19 u[IU]/mL (ref 0.35–5.50)

## 2022-08-12 LAB — HEMOGLOBIN A1C: Hgb A1c MFr Bld: 5.9 % (ref 4.6–6.5)

## 2022-08-12 MED ORDER — PANTOPRAZOLE SODIUM 40 MG PO TBEC
40.0000 mg | DELAYED_RELEASE_TABLET | Freq: Every day | ORAL | 3 refills | Status: DC
  Filled 2022-08-12: qty 90, 90d supply, fill #0

## 2022-08-12 MED ORDER — PANTOPRAZOLE SODIUM 40 MG PO TBEC: 40.0000 mg | DELAYED_RELEASE_TABLET | Freq: Every day | ORAL | 3 refills | Status: DC

## 2022-08-12 NOTE — Progress Notes (Unsigned)
Patient ID: John Porter, male   DOB: 1985/11/04, 37 y.o.   MRN: [id number]         Chief Complaint:: wellness exam and cough, gerd, hld, hyperglyemia, low b12 and low Vit D       HPI:  John Porter is a 37 y.o. male here for wellness exam; up to date                        Also has 1-2 mo worsening persistent non prod cough, not worse at night, nothing makes better or worse.  Pt denies chest pain, increased sob or doe, wheezing, orthopnea, PND, increased LE swelling, palpitations, dizziness or syncope.   Pt denies polydipsia, polyuria, or new focal neuro s/s.    Pt denies fever, wt loss, night sweats, loss of appetite, or other constitutional symptoms  Does have mild worsening reflux, abut no bd pain, dysphagia, n/v, bowel change or blood. Does have several wks ongoing nasal allergy symptoms with clearish congestion, itch and sneezing, without fever, pain, ST, swelling or wheezing.    Wt Readings from Last 3 Encounters:  08/12/22 (!) 313 lb (142 kg)  08/05/22 (!) 316 lb 4 oz (143.5 kg)  01/10/22 (!) 303 lb 6.4 oz (137.6 kg)   BP Readings from Last 3 Encounters:  08/12/22 118/78  08/05/22 (!) 132/94  01/10/22 122/77   Immunization History  Administered Date(s) Administered   Influenza,inj,Quad PF,6+ Mos 12/25/2014, 11/16/2015   Influenza-Unspecified 12/07/2016, 11/07/2018, 02/04/2021   Moderna Sars-Covid-2 Vaccination 05/23/2019, 06/20/2019, 07/22/2019   Td 02/14/2006   Tdap 03/02/2017  There are no preventive care reminders to display for this patient.    Past Medical History:  Diagnosis Date   Hyperlipidemia 06/01/2011   Obesity    History reviewed. No pertinent surgical history.  reports that he has never smoked. He has never used smokeless tobacco. He reports that he does not drink alcohol and does not use drugs. family history includes Asthma in his father; Hyperlipidemia in his father; Hypertension in his father and mother. No Known Allergies No current outpatient  medications on file prior to visit.   No current facility-administered medications on file prior to visit.        ROS:  All others reviewed and negative.  Objective        PE:  BP 118/78 (BP Location: Left Arm, Patient Position: Sitting, Cuff Size: Large)   Pulse 100   Temp 98.6 F (37 C) (Oral)   Ht 5\' 8"  (1.727 m)   Wt (!) 313 lb (142 kg)   SpO2 98%   BMI 47.59 kg/m                 Constitutional: Pt appears in NAD               HENT: Head: NCAT.                Right Ear: External ear normal.                 Left Ear: External ear normal. Bilat tm's with mild erythema.  Max sinus areas non tender.  Pharynx with mild erythema, no exudate               Eyes: . Pupils are equal, round, and reactive to light. Conjunctivae and EOM are normal               Nose: without d/c or deformity  Neck: Neck supple. Gross normal ROM               Cardiovascular: Normal rate and regular rhythm.                 Pulmonary/Chest: Effort normal and breath sounds without rales or wheezing.                Abd:  Soft, NT, ND, + BS, no organomegaly               Neurological: Pt is alert. At baseline orientation, motor grossly intact               Skin: Skin is warm. No rashes, no other new lesions, LE edema - none               Psychiatric: Pt behavior is normal without agitation   Micro: none  Cardiac tracings I have personally interpreted today:  none  Pertinent Radiological findings (summarize): none   Lab Results  Component Value Date   WBC 6.1 08/12/2022   HGB 12.9 (L) 08/12/2022   HCT 41.5 08/12/2022   PLT 262.0 08/12/2022   GLUCOSE 93 08/12/2022   CHOL 177 08/12/2022   TRIG 150.0 (H) 08/12/2022   HDL 31.60 (L) 08/12/2022   LDLDIRECT 133.0 09/12/2018   LDLCALC 115 (H) 08/12/2022   ALT 38 08/12/2022   AST 19 08/12/2022   NA 139 08/12/2022   K 4.2 08/12/2022   CL 105 08/12/2022   CREATININE 1.23 08/12/2022   BUN 19 08/12/2022   CO2 26 08/12/2022   TSH 2.19  08/12/2022   PSA 0.42 08/12/2022   INR 1.2 03/21/2019   HGBA1C 5.9 08/12/2022   Assessment/Plan:  John Porter is a 37 y.o. Black or African American [2] male with  has a past medical history of Hyperlipidemia (06/01/2011) and Obesity.  Encounter for well adult exam with abnormal findings Age and sex appropriate education and counseling updated with regular exercise and diet Referrals for preventative services - none needed Immunizations addressed - none needed Smoking counseling  - none needed Evidence for depression or other mood disorder - none significant Most recent labs reviewed. I have personally reviewed and have noted: 1) the patient's medical and social history 2) The patient's current medications and supplements 3) The patient's height, weight, and BMI have been recorded in the chart   Hyperlipidemia Lab Results  Component Value Date   LDLCALC 115 (H) 08/12/2022   Uncontrolled, goal ldl < 100,  pt for lower chol diet, declines statin  Hyperglycemia Lab Results  Component Value Date   HGBA1C 5.9 08/12/2022   Stable, pt to continue current medical treatment  - diet, wt control  B12 deficiency Lab Results  Component Value Date   VITAMINB12 248 08/12/2022   Low, to start oral replacement - b12 1000 mcg qd   Vitamin D deficiency Last vitamin D Lab Results  Component Value Date   VD25OH 15.32 (L) 08/12/2022   Low, to start oral replacement   GERD (gastroesophageal reflux disease) Mild recent new onset, for protonix 40 qd  Cough Etiology unclear, ? Allergies or reflux vs other, for tx as above, also cxr  Allergic rhinitis Pt encouraged for otc allegra 180 every day prn, and nasacort asd Followup: Return in about 1 year (around 08/12/2023).  Oliver Barre, MD 08/14/2022 9:23 PM [facility]

## 2022-08-12 NOTE — Patient Instructions (Signed)
Please take all new medication as prescribed  - the protonix 40 mg per day  Ok to continue the Delsym for cough as needed  Please continue all other medications as before, and refills have been done if requested.  Please have the pharmacy call with any other refills you may need.  Please continue your efforts at being more active, low cholesterol diet, and weight control.  You are otherwise up to date with prevention measures today.  Please keep your appointments with your specialists as you may have planned  Please go to the XRAY Department in the first floor for the x-ray testing  Please go to the LAB at the blood drawing area for the tests to be done  You will be contacted by phone if any changes need to be made immediately.  Otherwise, you will receive a letter about your results with an explanation, but please check with MyChart first.  Please remember to sign up for MyChart if you have not done so, as this will be important to you in the future with finding out test results, communicating by private email, and scheduling acute appointments online when needed.  Please make an Appointment to return for your 1 year visit, or sooner if needed

## 2022-08-14 ENCOUNTER — Encounter: Payer: Self-pay | Admitting: Internal Medicine

## 2022-08-14 DIAGNOSIS — R059 Cough, unspecified: Secondary | ICD-10-CM | POA: Insufficient documentation

## 2022-08-14 DIAGNOSIS — K219 Gastro-esophageal reflux disease without esophagitis: Secondary | ICD-10-CM | POA: Insufficient documentation

## 2022-08-14 NOTE — Assessment & Plan Note (Signed)
Lab Results  Component Value Date   HGBA1C 5.9 08/12/2022   Stable, pt to continue current medical treatment  - diet, wt control

## 2022-08-14 NOTE — Assessment & Plan Note (Signed)
Last vitamin D Lab Results  Component Value Date   VD25OH 15.32 (L) 08/12/2022   Low, to start oral replacement

## 2022-08-14 NOTE — Assessment & Plan Note (Signed)
Lab Results  Component Value Date   LDLCALC 115 (H) 08/12/2022   Uncontrolled, goal ldl < 100,  pt for lower chol diet, declines statin

## 2022-08-14 NOTE — Assessment & Plan Note (Signed)
Pt encouraged for otc allegra 180 every day prn, and nasacort asd

## 2022-08-14 NOTE — Assessment & Plan Note (Signed)
Etiology unclear, ? Allergies or reflux vs other, for tx as above, also cxr

## 2022-08-14 NOTE — Assessment & Plan Note (Signed)

## 2022-08-14 NOTE — Assessment & Plan Note (Signed)
Lab Results  Component Value Date   VITAMINB12 248 08/12/2022   Low, to start oral replacement - b12 1000 mcg qd

## 2022-08-14 NOTE — Assessment & Plan Note (Signed)
Mild recent new onset, for protonix 40 qd

## 2022-08-15 ENCOUNTER — Ambulatory Visit: Payer: 59 | Admitting: Internal Medicine

## 2022-09-21 ENCOUNTER — Encounter: Payer: Self-pay | Admitting: Internal Medicine

## 2022-09-21 ENCOUNTER — Ambulatory Visit (INDEPENDENT_AMBULATORY_CARE_PROVIDER_SITE_OTHER): Payer: 59 | Admitting: Internal Medicine

## 2022-09-21 VITALS — HR 105 | Temp 98.4°F | Ht 68.0 in | Wt 316.5 lb

## 2022-09-21 DIAGNOSIS — E559 Vitamin D deficiency, unspecified: Secondary | ICD-10-CM

## 2022-09-21 DIAGNOSIS — R030 Elevated blood-pressure reading, without diagnosis of hypertension: Secondary | ICD-10-CM | POA: Diagnosis not present

## 2022-09-21 DIAGNOSIS — R739 Hyperglycemia, unspecified: Secondary | ICD-10-CM | POA: Diagnosis not present

## 2022-09-21 DIAGNOSIS — E538 Deficiency of other specified B group vitamins: Secondary | ICD-10-CM

## 2022-09-21 DIAGNOSIS — E7800 Pure hypercholesterolemia, unspecified: Secondary | ICD-10-CM | POA: Diagnosis not present

## 2022-09-21 DIAGNOSIS — K219 Gastro-esophageal reflux disease without esophagitis: Secondary | ICD-10-CM

## 2022-09-21 NOTE — Patient Instructions (Signed)
Please ask if your HR dept at work has a policy that helps pay for Wegovy (ozempic) or Zepbound (mounjaro)  Please continue all other medications as before, and refills have been done if requested.  Please have the pharmacy call with any other refills you may need.  Please continue your efforts at being more active, low cholesterol diet, and weight control.  Please keep your appointments with your specialists as you may have planned  Please make an Appointment to return for your 1 year visit, or sooner if needed, with Lab testing by Appointment as well, to be done about 3-5 days before at the FIRST FLOOR Lab (so this is for TWO appointments - please see the scheduling desk as you leave)

## 2022-09-21 NOTE — Progress Notes (Signed)
Patient ID: John Porter, male   DOB: 1985-02-29, 37 y.o.   MRN: [id number]        Chief Complaint: follow up elev BP, HLD and hyperglycemia , low vit d       HPI:  John Porter is a 37 y.o. male here overall doing ok,  Pt denies chest pain, increased sob or doe, wheezing, orthopnea, PND, increased LE swelling, palpitations, dizziness or syncope.   Pt denies polydipsia, polyuria, or new focal neuro s/s.    Pt denies fever, wt loss, night sweats, loss of appetite, or other constitutional symptoms  Denies worsening reflux, abd pain, dysphagia, n/v, bowel change or blood.        Wt Readings from Last 3 Encounters:  09/21/22 (!) 316 lb 8 oz (143.6 kg)  08/12/22 (!) 313 lb (142 kg)  08/05/22 (!) 316 lb 4 oz (143.5 kg)   BP Readings from Last 3 Encounters:  08/12/22 118/78  08/05/22 (!) 132/94  01/10/22 122/77         Past Medical History:  Diagnosis Date   Hyperlipidemia 06/01/2011   Obesity    History reviewed. No pertinent surgical history.  reports that he has never smoked. He has never used smokeless tobacco. He reports that he does not drink alcohol and does not use drugs. family history includes Asthma in his father; Hyperlipidemia in his father; Hypertension in his father and mother. No Known Allergies Current Outpatient Medications on File Prior to Visit  Medication Sig Dispense Refill   pantoprazole (PROTONIX) 40 MG tablet Take 1 tablet (40 mg total) by mouth daily. 90 tablet 3   No current facility-administered medications on file prior to visit.        ROS:  All others reviewed and negative.  Objective        PE:  Pulse (!) 105   Temp 98.4 F (36.9 C) (Temporal)   Ht 5\' 8"  (1.727 m)   Wt (!) 316 lb 8 oz (143.6 kg)   SpO2 96%   BMI 48.12 kg/m                 Constitutional: Pt appears in NAD               HENT: Head: NCAT.                Right Ear: External ear normal.                 Left Ear: External ear normal.                Eyes: . Pupils are equal, round,  and reactive to light. Conjunctivae and EOM are normal               Nose: without d/c or deformity               Neck: Neck supple. Gross normal ROM               Cardiovascular: Normal rate and regular rhythm.                 Pulmonary/Chest: Effort normal and breath sounds without rales or wheezing.                Abd:  Soft, NT, ND, + BS, no organomegaly               Neurological: Pt is alert. At baseline orientation, motor grossly intact  Skin: Skin is warm. No rashes, no other new lesions, LE edema - none               Psychiatric: Pt behavior is normal without agitation   Micro: none  Cardiac tracings I have personally interpreted today:  none  Pertinent Radiological findings (summarize): none   Lab Results  Component Value Date   WBC 6.1 08/12/2022   HGB 12.9 (L) 08/12/2022   HCT 41.5 08/12/2022   PLT 262.0 08/12/2022   GLUCOSE 93 08/12/2022   CHOL 177 08/12/2022   TRIG 150.0 (H) 08/12/2022   HDL 31.60 (L) 08/12/2022   LDLDIRECT 133.0 09/12/2018   LDLCALC 115 (H) 08/12/2022   ALT 38 08/12/2022   AST 19 08/12/2022   NA 139 08/12/2022   K 4.2 08/12/2022   CL 105 08/12/2022   CREATININE 1.23 08/12/2022   BUN 19 08/12/2022   CO2 26 08/12/2022   TSH 2.19 08/12/2022   PSA 0.42 08/12/2022   INR 1.2 03/21/2019   HGBA1C 5.9 08/12/2022   Assessment/Plan:  John Porter is a 37 y.o. Black or African American [2] male with  has a past medical history of Hyperlipidemia (06/01/2011) and Obesity.  Vitamin D deficiency Last vitamin D Lab Results  Component Value Date   VD25OH 15.32 (L) 08/12/2022   Low, to start oral replacement   Hyperlipidemia Lab Results  Component Value Date   LDLCALC 115 (H) 08/12/2022   Uncontrolled, goal ldl < 100,, pt for lower chol diet, declines statin for now   Hyperglycemia Lab Results  Component Value Date   HGBA1C 5.9 08/12/2022   Stable, pt to continue current medical treatment  - diet, wt control - consider wegovy  or zepbound if covered by his employer   B12 deficiency Lab Results  Component Value Date   VITAMINB12 248 08/12/2022   Low, to start oral replacement - b12 1000 mcg qd   Blood pressure elevated without history of HTN Stable, cont current med tx  GERD (gastroesophageal reflux disease) Improved, now stable on PPI, cont to work on diet and wt loss  Followup: Return in about 6 months (around 03/24/2023).  Oliver Barre, MD 09/21/2022 1:42 PM [facility]

## 2022-09-21 NOTE — Assessment & Plan Note (Signed)
Lab Results  Component Value Date   VITAMINB12 248 08/12/2022   Low, to start oral replacement - b12 1000 mcg qd

## 2022-09-21 NOTE — Assessment & Plan Note (Signed)
Lab Results  Component Value Date   HGBA1C 5.9 08/12/2022   Stable, pt to continue current medical treatment  - diet, wt control - consider wegovy or zepbound if covered by his employer

## 2022-09-21 NOTE — Assessment & Plan Note (Signed)
Last vitamin D Lab Results  Component Value Date   VD25OH 15.32 (L) 08/12/2022   Low, to start oral replacement

## 2022-09-21 NOTE — Assessment & Plan Note (Signed)
Lab Results  Component Value Date   LDLCALC 115 (H) 08/12/2022   Uncontrolled, goal ldl < 100,, pt for lower chol diet, declines statin for now

## 2022-09-21 NOTE — Assessment & Plan Note (Signed)
Improved, now stable on PPI, cont to work on diet and wt loss

## 2022-09-21 NOTE — Assessment & Plan Note (Signed)
Stable, cont current med tx 

## 2022-09-26 ENCOUNTER — Encounter: Payer: 59 | Admitting: Internal Medicine

## 2022-10-02 ENCOUNTER — Encounter: Payer: Self-pay | Admitting: Internal Medicine

## 2022-10-02 DIAGNOSIS — L989 Disorder of the skin and subcutaneous tissue, unspecified: Secondary | ICD-10-CM

## 2022-10-12 ENCOUNTER — Ambulatory Visit: Payer: 59 | Admitting: Internal Medicine

## 2022-10-26 ENCOUNTER — Ambulatory Visit: Payer: 59 | Admitting: Internal Medicine

## 2022-10-26 ENCOUNTER — Encounter: Payer: Self-pay | Admitting: Internal Medicine

## 2022-10-26 VITALS — BP 120/76 | HR 93 | Temp 98.7°F | Ht 68.0 in | Wt 315.0 lb

## 2022-10-26 DIAGNOSIS — R030 Elevated blood-pressure reading, without diagnosis of hypertension: Secondary | ICD-10-CM

## 2022-10-26 DIAGNOSIS — R739 Hyperglycemia, unspecified: Secondary | ICD-10-CM

## 2022-10-26 DIAGNOSIS — L814 Other melanin hyperpigmentation: Secondary | ICD-10-CM | POA: Diagnosis not present

## 2022-10-26 DIAGNOSIS — E538 Deficiency of other specified B group vitamins: Secondary | ICD-10-CM | POA: Diagnosis not present

## 2022-10-26 DIAGNOSIS — E7800 Pure hypercholesterolemia, unspecified: Secondary | ICD-10-CM | POA: Diagnosis not present

## 2022-10-26 DIAGNOSIS — Z23 Encounter for immunization: Secondary | ICD-10-CM | POA: Diagnosis not present

## 2022-10-26 DIAGNOSIS — E559 Vitamin D deficiency, unspecified: Secondary | ICD-10-CM | POA: Diagnosis not present

## 2022-10-26 NOTE — Progress Notes (Signed)
Patient ID: John Porter, male   DOB: 06/01/1985, 37 y.o.   MRN: [id number]        Chief Complaint: follow up facial darkening bilateral, low b12 and vit d, elevated bp, hyperglycemia, hld       HPI:  John Porter is a 37 y.o. male here overall doing ok but has 1 mo onset bilateral darkening skin to the bilateral facies, Pt denies chest pain, increased sob or doe, wheezing, orthopnea, PND, increased LE swelling, palpitations, dizziness or syncope.   Pt denies polydipsia, polyuria, or new focal neuro s/s.    Pt denies fever, wt loss, night sweats, loss of appetite, or other constitutional symptoms         Wt Readings from Last 3 Encounters:  10/26/22 (!) 315 lb (142.9 kg)  09/21/22 (!) 316 lb 8 oz (143.6 kg)  08/12/22 (!) 313 lb (142 kg)   BP Readings from Last 3 Encounters:  10/26/22 120/76  08/12/22 118/78  08/05/22 (!) 132/94         Past Medical History:  Diagnosis Date   Hyperlipidemia 06/01/2011   Obesity    History reviewed. No pertinent surgical history.  reports that he has never smoked. He has never used smokeless tobacco. He reports that he does not drink alcohol and does not use drugs. family history includes Asthma in his father; Hyperlipidemia in his father; Hypertension in his father and mother. No Known Allergies Current Outpatient Medications on File Prior to Visit  Medication Sig Dispense Refill   pantoprazole (PROTONIX) 40 MG tablet Take 1 tablet (40 mg total) by mouth daily. 90 tablet 3   No current facility-administered medications on file prior to visit.        ROS:  All others reviewed and negative.  Objective        PE:  BP 120/76 (BP Location: Right Arm, Patient Position: Sitting, Cuff Size: Normal)   Pulse 93   Temp 98.7 F (37.1 C) (Oral)   Ht 5\' 8"  (1.727 m)   Wt (!) 315 lb (142.9 kg)   SpO2 96%   BMI 47.90 kg/m                 Constitutional: Pt appears in NAD               HENT: Head: NCAT.                Right Ear: External ear normal.                  Left Ear: External ear normal.                Eyes: . Pupils are equal, round, and reactive to light. Conjunctivae and EOM are normal               Nose: without d/c or deformity               Neck: Neck supple. Gross normal ROM               Cardiovascular: Normal rate and regular rhythm.                 Pulmonary/Chest: Effort normal and breath sounds without rales or wheezing.                Abd:  Soft, NT, ND, + BS, no organomegaly               Neurological: Pt is  alert. At baseline orientation, motor grossly intact               Skin: Skin is warm. No rashes but has bilateral facial darkening from temple to jaw lateral facies, no other new lesions, LE edema - none               Psychiatric: Pt behavior is normal without agitation   Micro: none  Cardiac tracings I have personally interpreted today:  none  Pertinent Radiological findings (summarize): none   Lab Results  Component Value Date   WBC 6.1 08/12/2022   HGB 12.9 (L) 08/12/2022   HCT 41.5 08/12/2022   PLT 262.0 08/12/2022   GLUCOSE 93 08/12/2022   CHOL 177 08/12/2022   TRIG 150.0 (H) 08/12/2022   HDL 31.60 (L) 08/12/2022   LDLDIRECT 133.0 09/12/2018   LDLCALC 115 (H) 08/12/2022   ALT 38 08/12/2022   AST 19 08/12/2022   NA 139 08/12/2022   K 4.2 08/12/2022   CL 105 08/12/2022   CREATININE 1.23 08/12/2022   BUN 19 08/12/2022   CO2 26 08/12/2022   TSH 2.19 08/12/2022   PSA 0.42 08/12/2022   INR 1.2 03/21/2019   HGBA1C 5.9 08/12/2022   Assessment/Plan:  John Porter is a 37 y.o. Black or African American [2] male with  has a past medical history of Hyperlipidemia (06/01/2011) and Obesity.  B12 deficiency Lab Results  Component Value Date   VITAMINB12 248 08/12/2022   Low, to start oral replacement - b12 1000 mcg qd   Blood pressure elevated without history of HTN BP Readings from Last 3 Encounters:  10/26/22 120/76  08/12/22 118/78  08/05/22 (!) 132/94   Stable, pt to continue medical  treatment  - diet, wt control  Darkening of skin Etiology unclear, for derm referral  Hyperglycemia Lab Results  Component Value Date   HGBA1C 5.9 08/12/2022   Stable, pt to continue current medical treatment  - diet, wt control   Hyperlipidemia Lab Results  Component Value Date   LDLCALC 115 (H) 08/12/2022   Mild uncontrolled, for lower chol diet, declines statin  Vitamin D deficiency Last vitamin D Lab Results  Component Value Date   VD25OH 15.32 (L) 08/12/2022   Low, to start oral replacement  Followup: Return if symptoms worsen or fail to improve.  Oliver Barre, MD 10/28/2022 9:44 PM [facility]

## 2022-10-26 NOTE — Patient Instructions (Signed)
Please continue all other medications as before, and refills have been done if requested.  Please have the pharmacy call with any other refills you may need.  Please continue your efforts at being more active, low cholesterol diet, and weight control.  Please keep your appointments with your specialists as you may have planned  You will be contacted regarding the referral for: dermatology

## 2022-10-28 ENCOUNTER — Encounter: Payer: Self-pay | Admitting: Internal Medicine

## 2022-10-28 NOTE — Assessment & Plan Note (Signed)
Etiology unclear, for derm referral ?

## 2022-10-28 NOTE — Assessment & Plan Note (Signed)
Lab Results  Component Value Date   VITAMINB12 248 08/12/2022   Low, to start oral replacement - b12 1000 mcg qd

## 2022-10-28 NOTE — Assessment & Plan Note (Signed)
Lab Results  Component Value Date   HGBA1C 5.9 08/12/2022   Stable, pt to continue current medical treatment  - diet,wt control

## 2022-10-28 NOTE — Assessment & Plan Note (Signed)
Last vitamin D Lab Results  Component Value Date   VD25OH 15.32 (L) 08/12/2022   Low, to start oral replacement

## 2022-10-28 NOTE — Assessment & Plan Note (Signed)
BP Readings from Last 3 Encounters:  10/26/22 120/76  08/12/22 118/78  08/05/22 (!) 132/94   Stable, pt to continue medical treatment  - diet, wt control

## 2022-10-28 NOTE — Assessment & Plan Note (Signed)
Lab Results  Component Value Date   LDLCALC 115 (H) 08/12/2022   Mild uncontrolled, for lower chol diet, declines statin

## 2023-02-19 ENCOUNTER — Telehealth: Payer: 59 | Admitting: Physician Assistant

## 2023-02-19 DIAGNOSIS — J019 Acute sinusitis, unspecified: Secondary | ICD-10-CM

## 2023-02-19 MED ORDER — LEVOCETIRIZINE DIHYDROCHLORIDE 5 MG PO TABS: 5.0000 mg | ORAL_TABLET | Freq: Every evening | ORAL | 0 refills | Status: AC

## 2023-02-19 MED ORDER — AMOXICILLIN-POT CLAVULANATE 875-125 MG PO TABS: 1.0000 | ORAL_TABLET | Freq: Two times a day (BID) | ORAL | 0 refills | Status: AC

## 2023-02-19 NOTE — Progress Notes (Signed)
 I have spent 5 minutes in review of e-visit questionnaire, review and updating patient chart, medical decision making and response to patient.   Laure Kidney, PA-C

## 2023-02-19 NOTE — Progress Notes (Signed)

## 2023-03-24 ENCOUNTER — Ambulatory Visit: Payer: 59 | Admitting: Internal Medicine

## 2023-04-04 ENCOUNTER — Ambulatory Visit: Payer: 59 | Admitting: Internal Medicine

## 2023-04-04 ENCOUNTER — Encounter: Payer: Self-pay | Admitting: Internal Medicine

## 2023-04-04 ENCOUNTER — Other Ambulatory Visit (HOSPITAL_BASED_OUTPATIENT_CLINIC_OR_DEPARTMENT_OTHER): Payer: Self-pay

## 2023-04-04 VITALS — BP 120/74 | HR 85 | Temp 98.4°F | Ht 68.0 in | Wt 314.0 lb

## 2023-04-04 DIAGNOSIS — J069 Acute upper respiratory infection, unspecified: Secondary | ICD-10-CM

## 2023-04-04 DIAGNOSIS — Z0001 Encounter for general adult medical examination with abnormal findings: Secondary | ICD-10-CM

## 2023-04-04 DIAGNOSIS — Z0000 Encounter for general adult medical examination without abnormal findings: Secondary | ICD-10-CM

## 2023-04-04 DIAGNOSIS — E559 Vitamin D deficiency, unspecified: Secondary | ICD-10-CM

## 2023-04-04 DIAGNOSIS — E538 Deficiency of other specified B group vitamins: Secondary | ICD-10-CM | POA: Diagnosis not present

## 2023-04-04 DIAGNOSIS — R739 Hyperglycemia, unspecified: Secondary | ICD-10-CM

## 2023-04-04 DIAGNOSIS — E7800 Pure hypercholesterolemia, unspecified: Secondary | ICD-10-CM

## 2023-04-04 MED ORDER — AZITHROMYCIN 250 MG PO TABS
ORAL_TABLET | ORAL | 1 refills | Status: AC
  Filled 2023-04-04: qty 6, 5d supply, fill #0

## 2023-04-04 NOTE — Progress Notes (Signed)
 Patient ID: John Porter, male   DOB: 23-Sep-1985, 38 y.o.   MRN: [id number]         Chief Complaint:: wellness exam and URI, low vit d, hld, hyperglycemia, b12 deficieicny       HPI:  John Porter is a 38 y.o. male here for wellness exam; up to date                        Also Pt denies chest pain, increased sob or doe, wheezing, orthopnea, PND, increased LE swelling, palpitations, dizziness or syncope.   Pt denies polydipsia, polyuria, or new focal neuro s/s.    Pt denies fever, wt loss, night sweats, loss of appetite, or other constitutional symptoms   Here with 2-3 days acute onset fever, facial pain, pressure, headache, general weakness and malaise, and greenish d/c, with mild ST and cough,  Wt Readings from Last 3 Encounters:  04/04/23 (!) 314 lb (142.4 kg)  10/26/22 (!) 315 lb (142.9 kg)  09/21/22 (!) 316 lb 8 oz (143.6 kg)   BP Readings from Last 3 Encounters:  04/04/23 120/74  10/26/22 120/76  08/12/22 118/78   Immunization History  Administered Date(s) Administered   Influenza, Seasonal, Injecte, Preservative Fre 10/26/2022   Influenza,inj,Quad PF,6+ Mos 12/25/2014, 11/16/2015   Influenza-Unspecified 12/07/2016, 11/07/2018, 02/04/2021   Moderna Sars-Covid-2 Vaccination 05/23/2019, 06/20/2019, 07/22/2019   Td 02/14/2006   Tdap 03/02/2017  There are no preventive care reminders to display for this patient.    Past Medical History:  Diagnosis Date   Hyperlipidemia 06/01/2011   Obesity    History reviewed. No pertinent surgical history.  reports that he has never smoked. He has never used smokeless tobacco. He reports that he does not drink alcohol and does not use drugs. family history includes Asthma in his father; Hyperlipidemia in his father; Hypertension in his father and mother. No Known Allergies Current Outpatient Medications on File Prior to Visit  Medication Sig Dispense Refill   levocetirizine (XYZAL ALLERGY 24HR) 5 MG tablet Take 1 tablet (5 mg total) by mouth  every evening for 14 days. 14 tablet 0   No current facility-administered medications on file prior to visit.        ROS:  All others reviewed and negative.  Objective        PE:  BP 120/74 (BP Location: Right Arm, Patient Position: Sitting, Cuff Size: Normal)   Pulse 85   Temp 98.4 F (36.9 C) (Oral)   Ht 5\' 8"  (1.727 m)   Wt (!) 314 lb (142.4 kg)   SpO2 98%   BMI 47.74 kg/m                 Constitutional: Pt appears in NAD               HENT: Head: NCAT.                Right Ear: External ear normal.                 Left Ear: External ear normal. Bilat tm's with mild erythema.  Max sinus areas mild tender.  Pharynx with mild erythema, no exudate                 Eyes: . Pupils are equal, round, and reactive to light. Conjunctivae and EOM are normal               Nose: without d/c or deformity  Neck: Neck supple. Gross normal ROM               Cardiovascular: Normal rate and regular rhythm.                 Pulmonary/Chest: Effort normal and breath sounds without rales or wheezing.                Abd:  Soft, NT, ND, + BS, no organomegaly               Neurological: Pt is alert. At baseline orientation, motor grossly intact               Skin: Skin is warm. No rashes, no other new lesions, LE edema - none               Psychiatric: Pt behavior is normal without agitation   Micro: none  Cardiac tracings I have personally interpreted today:  none  Pertinent Radiological findings (summarize): none   Lab Results  Component Value Date   WBC 6.1 08/12/2022   HGB 12.9 (L) 08/12/2022   HCT 41.5 08/12/2022   PLT 262.0 08/12/2022   GLUCOSE 93 08/12/2022   CHOL 177 08/12/2022   TRIG 150.0 (H) 08/12/2022   HDL 31.60 (L) 08/12/2022   LDLDIRECT 133.0 09/12/2018   LDLCALC 115 (H) 08/12/2022   ALT 38 08/12/2022   AST 19 08/12/2022   NA 139 08/12/2022   K 4.2 08/12/2022   CL 105 08/12/2022   CREATININE 1.23 08/12/2022   BUN 19 08/12/2022   CO2 26 08/12/2022   TSH  2.19 08/12/2022   PSA 0.42 08/12/2022   INR 1.2 03/21/2019   HGBA1C 5.9 08/12/2022   Assessment/Plan:  John Porter is a 38 y.o. Black or African American [2] male with  has a past medical history of Hyperlipidemia (06/01/2011) and Obesity.  Encounter for well adult exam with abnormal findings Age and sex appropriate education and counseling updated with regular exercise and diet Referrals for preventative services - none needed Immunizations addressed - none needed Smoking counseling  - none needed Evidence for depression or other mood disorder - none significant Most recent labs reviewed. I have personally reviewed and have noted: 1) the patient's medical and social history 2) The patient's current medications and supplements 3) The patient's height, weight, and BMI have been recorded in the chart   Vitamin D deficiency Last vitamin D Lab Results  Component Value Date   VD25OH 15.32 (L) 08/12/2022   Low,, to start oral replacement   URI (upper respiratory infection) Mild to mod, for antibx course zpack a 1,,  to f/u any worsening symptoms or concerns  Hyperlipidemia Lab Results  Component Value Date   LDLCALC 115 (H) 08/12/2022   uncontrolled, pt for lower chol diet, declines statin   Hyperglycemia Lab Results  Component Value Date   HGBA1C 5.9 08/12/2022   Stable, pt to continue current medical treatment  - diet, wt control   B12 deficiency Lab Results  Component Value Date   VITAMINB12 248 08/12/2022   Low, to start oral replacement - b12 1000 mcg qd  Followup: Return in about 6 months (around 10/02/2023).  Oliver Barre, MD 04/08/2023 7:40 PM [facility]

## 2023-04-04 NOTE — Patient Instructions (Addendum)
Please take all new medication as prescribed - the antibiotic  Please call if you would want to start the Zepbound  Please continue all other medications as before, and refills have been done if requested.  Please have the pharmacy call with any other refills you may need.  Please continue your efforts at being more active, low cholesterol diet, and weight control.  You are otherwise up to date with prevention measures today.  Please keep your appointments with your specialists as you may have planned  Please go to the LAB at the blood drawing area for the tests to be done  You will be contacted by phone if any changes need to be made immediately.  Otherwise, you will receive a letter about your results with an explanation, but please check with MyChart first.  Please make an Appointment to return in 6 months, or sooner if needed

## 2023-04-05 ENCOUNTER — Encounter: Payer: Self-pay | Admitting: Internal Medicine

## 2023-04-05 DIAGNOSIS — Z202 Contact with and (suspected) exposure to infections with a predominantly sexual mode of transmission: Secondary | ICD-10-CM

## 2023-04-08 ENCOUNTER — Encounter: Payer: Self-pay | Admitting: Internal Medicine

## 2023-04-08 DIAGNOSIS — J069 Acute upper respiratory infection, unspecified: Secondary | ICD-10-CM | POA: Insufficient documentation

## 2023-04-08 NOTE — Assessment & Plan Note (Signed)
 Mild to mod, for antibx course zpack a 1,,  to f/u any worsening symptoms or concerns

## 2023-04-08 NOTE — Assessment & Plan Note (Signed)
 Lab Results  Component Value Date   HGBA1C 5.9 08/12/2022   Stable, pt to continue current medical treatment  - diet,wt control

## 2023-04-08 NOTE — Assessment & Plan Note (Signed)
 Lab Results  Component Value Date   LDLCALC 115 (H) 08/12/2022   uncontrolled, pt for lower chol diet, declines statin

## 2023-04-08 NOTE — Assessment & Plan Note (Signed)
 Last vitamin D Lab Results  Component Value Date   VD25OH 15.32 (L) 08/12/2022   Low,, to start oral replacement

## 2023-04-08 NOTE — Assessment & Plan Note (Signed)

## 2023-04-08 NOTE — Assessment & Plan Note (Signed)
 Lab Results  Component Value Date   VITAMINB12 248 08/12/2022   Low, to start oral replacement - b12 1000 mcg qd

## 2023-05-24 ENCOUNTER — Encounter: Payer: Self-pay | Admitting: Internal Medicine

## 2023-05-25 ENCOUNTER — Other Ambulatory Visit (HOSPITAL_BASED_OUTPATIENT_CLINIC_OR_DEPARTMENT_OTHER): Payer: Self-pay

## 2023-05-25 ENCOUNTER — Telehealth: Payer: Self-pay | Admitting: Pharmacist

## 2023-05-25 MED ORDER — WEGOVY 0.25 MG/0.5ML ~~LOC~~ SOAJ
0.2500 mg | SUBCUTANEOUS | 11 refills | Status: DC
  Filled 2023-05-25: qty 2, 28d supply, fill #0

## 2023-05-25 NOTE — Telephone Encounter (Signed)
 Pharmacy Patient Advocate Encounter   Received notification from Patient Pharmacy that prior authorization for Wegovy 0.25MG /0.5ML auto-injectors is required/requested.   Insurance verification completed.   The patient is insured through [facility] .   Per test claim: PA required; PA submitted to above mentioned insurance via CoverMyMeds Key/confirmation #/[id number] Status is pending

## 2023-05-26 NOTE — Telephone Encounter (Signed)
 Pharmacy Patient Advocate Encounter  Received notification from [facility] that Prior Authorization for [facility] 0.25MG /0.5ML auto-injectors has been DENIED.  See denial reason below. No denial letter attached in CMM. Will attach denial letter to Media tab once received.   PA #/Case ID/Reference #: [id number]

## 2023-05-26 NOTE — Telephone Encounter (Signed)
 Patient has been made aware and gave a verbal understanding.

## 2023-05-31 ENCOUNTER — Other Ambulatory Visit (HOSPITAL_COMMUNITY): Payer: Self-pay

## 2023-10-29 ENCOUNTER — Other Ambulatory Visit: Payer: Self-pay | Admitting: Internal Medicine

## 2024-01-22 ENCOUNTER — Encounter

## 2024-01-22 ENCOUNTER — Telehealth: Admitting: Physician Assistant

## 2024-01-22 DIAGNOSIS — J069 Acute upper respiratory infection, unspecified: Secondary | ICD-10-CM

## 2024-01-22 MED ORDER — PROMETHAZINE-DM 6.25-15 MG/5ML PO SYRP
5.0000 mL | ORAL_SOLUTION | Freq: Four times a day (QID) | ORAL | 0 refills | Status: AC | PRN
  Filled 2024-01-23: qty 118, 6d supply, fill #0

## 2024-01-22 MED ORDER — IPRATROPIUM BROMIDE 0.03 % NA SOLN
2.0000 | Freq: Two times a day (BID) | NASAL | 0 refills | Status: AC
  Filled 2024-01-23: qty 30, 75d supply, fill #0

## 2024-01-22 NOTE — Progress Notes (Signed)

## 2024-01-22 NOTE — Progress Notes (Signed)
 Message sent to patient requesting further input regarding current symptoms. Awaiting patient response.

## 2024-01-23 ENCOUNTER — Other Ambulatory Visit (HOSPITAL_BASED_OUTPATIENT_CLINIC_OR_DEPARTMENT_OTHER): Payer: Self-pay
